# Patient Record
Sex: Female | Born: 2000 | Race: White | Hispanic: No | Marital: Single | Smoking: Never smoker
Health system: Southern US, Community
[De-identification: ages and names within clinical notes are randomized; demographics above are authoritative.]

## PROBLEM LIST (undated history)

## (undated) HISTORY — PX: TYMPANOSTOMY TUBE PLACEMENT: SHX32

---
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
## 2021-01-18 ENCOUNTER — Telehealth (INDEPENDENT_AMBULATORY_CARE_PROVIDER_SITE_OTHER): Payer: Medicaid Other | Admitting: Family

## 2021-01-18 ENCOUNTER — Other Ambulatory Visit: Payer: Self-pay

## 2021-01-18 ENCOUNTER — Encounter: Payer: Self-pay | Admitting: Family

## 2021-01-18 DIAGNOSIS — F411 Generalized anxiety disorder: Secondary | ICD-10-CM

## 2021-01-18 DIAGNOSIS — Z719 Counseling, unspecified: Secondary | ICD-10-CM | POA: Diagnosis not present

## 2021-01-18 DIAGNOSIS — Z7189 Other specified counseling: Secondary | ICD-10-CM

## 2021-01-18 DIAGNOSIS — Z79899 Other long term (current) drug therapy: Secondary | ICD-10-CM | POA: Diagnosis not present

## 2021-01-18 DIAGNOSIS — F900 Attention-deficit hyperactivity disorder, predominantly inattentive type: Secondary | ICD-10-CM | POA: Diagnosis not present

## 2021-01-18 NOTE — Progress Notes (Signed)
[facility] [address], [place] [place] [address] Dept: [phone] Dept Fax: [phone]  Medication Check visit via Virtual Video   Patient ID:  [name]  female DOB: 01/21/2001   20 y.o.   MRN: [id number]   DATE:01/18/21  PCP: [name] Corning, MD  Virtual Visit via Video Note  I connected with  [name]  on 01/18/21 at  7:30 AM EDT by a video enabled telemedicine application and verified that I am speaking with the correct person using two identifiers. Patient/Parent Location: at school   [name],you are scheduled for a virtual visit with your provider today.    Just as we do with appointments in the office, we must obtain your consent to participate.  Your consent will be active for this visit and any virtual visit you may have with one of our providers in the next 365 days.    If you have a MyChart account, I can also send a copy of this consent to you electronically.  All virtual visits are billed to your insurance company just like a traditional visit in the office.  As this is a virtual visit, video technology does not allow for your provider to perform a traditional examination.  This may limit your provider's ability to fully assess your condition.  If your provider identifies any concerns that need to be evaluated in person or the need to arrange testing such as labs, EKG, etc, we will make arrangements to do so.    Although advances in technology are sophisticated, we cannot ensure that it will always work on either your end or our end.  If the connection with a video visit is poor, we may have to switch to a telephone visit.  With either a video or telephone visit, we are not always able to ensure that we have a secure connection.   I need to obtain your verbal consent now.   Are you willing to proceed with your visit today?   [name] has provided verbal consent on 01/18/2021 for a  virtual visit (video or telephone).  [name] Curie, NP 01/18/2021  7:10 PM   I discussed the limitations, risks, security and privacy concerns of performing an evaluation and management service by telephone and the availability of in person appointments. I also discussed with the parents that there may be a patient responsible charge related to this service. The parents expressed understanding and agreed to proceed.  Provider: Carron Curie, NP  Location: private work location  HPI/CURRENT STATUS: [name] is here for medication management of the psychoactive medications for ADHD and review of educational and behavioral concerns.   [name] currently taking Concerta 36 mg daily, which is working well. Takes medication in the morning with breakfast. Medication tends to wear off around evening time and can take her Ritalin 5 mg PRN. [name] is able to focus through school/homework.   [name] is eating well (eating breakfast, lunch and dinner). Eating well with no issues reported.  Sleeping well (goes to bed at 12:00 am wakes at 9:00 am), sleeping through the night.   EDUCATION: School: [employer] basic classes Year/Grade:  freshman   Performance/ Grades: above average Services: Other: No services Sorority at [employer] and nightly meetings Chess club Activities/ Exercise: intermittently  Screen time: (phone, tablet, TV, computer): computer, phone, TV and movies.   MEDICAL HISTORY: Individual Medical History/ Review of Systems: Yes, Cold/Cough  from change in environment with moving into dorm room.   Family Medical/ Social History: Changes? No Patient Lives with:  has a roommate in a suite style room  MENTAL HEALTH: Mental Health Issues:    None reported by patient     Allergies: Allergies  Allergen Reactions   Norelgestromin-Eth Estradiol Dermatitis   Current Medications:  Current Outpatient Medications on File Prior to Visit  Medication Sig Dispense Refill    AUROVELA 24 FE 1-20 MG-MCG(24) tablet Take 1 tablet by mouth daily.     DIFFERIN 0.1 % cream Apply topically.     methylphenidate (CONCERTA) 36 MG PO CR tablet Take 2 tablets (72 mg total) by mouth every morning. 60 tablet 0   methylphenidate (RITALIN) 5 MG tablet Take 1 tablet (5 mg total) by mouth as needed. 30 tablet 0   ibuprofen (ADVIL,MOTRIN) 600 MG tablet Take 1 tablet (600 mg total) by mouth every 6 (six) hours as needed. (Patient not taking: No sig reported) 30 tablet 0   No current facility-administered medications on file prior to visit.   Medication Side Effects: None  DIAGNOSES:    ICD-10-CM   1. ADHD (attention deficit hyperactivity disorder), inattentive type  F90.0     2. Generalized anxiety disorder  F41.1     3. Medication management  Z79.899     4. Patient counseled  Z71.9     5. Goals of care, counseling/discussion  Z71.89      ASSESSMENT: [name] is a 20 year old female with a history of ADHD and anxiety, She has been well controlled on Concerta 36 mg 1-2 daily for 6 out of 7 days and using Ritalin 5 mg PRN for homework. Started school at [employer] this semester with positive report with classes both in person and online for fall semester. Participating in [employer] and joined a sorority at school. Some activity on a regular basis with her sorority sisters. NO changes with eating or sleeping at school. Had a recent viral illness with cold symptoms with OTC treatment and no other health concerns. To continue with the same medication with no changes today.   PLAN/RECOMMENDATIONS:  Patient provided updates with school, adjustment to the class schedule, sorority and social events.  [name] is not currently getting services at school. Extra help is available if needed with TA groups or other tutoring.   Recent issues with a viral illness with cough/cold with changes in environment. OTC medication for treatment. Support and education provided for immune support.   Discussed structure  at school and daily routine to assist with consistency along with academic success.   Eating well with no concerns with getting "edible" food at school. Encouraged a healthy variety of foods daily and plenty of water.   Discussed exercise, activities and social interactions on campus with her sorority  Sleep hygiene and routine each night discussed. Patient not having any sleep issues or adjustment in new environment.   Counseled medication pharmacokinetics, options, dosage, administration, desired effects, and possible side effects.   Concerta 36 mg 1-2 daily, no Rx today Ritaln 5 mg in the pm PRN   I discussed the assessment and treatment plan with the patient. The patient was provided an opportunity to ask questions and all were answered. The patient agreed with the plan and demonstrated an understanding of the instructions.   I provided 27 minutes of non-face-to-face time during this encounter. Completed record review for 10 minutes prior to the virtual video visit.   NEXT APPOINTMENT:  02/22/2021  Return in about 3 months (around 04/19/2021) for f/u visit.  The patient was advised to call back or seek an in-person evaluation if the symptoms worsen or if the condition fails to improve as anticipated.   [name] Curie, NP

## 2021-01-28 ENCOUNTER — Emergency Department (HOSPITAL_COMMUNITY): Payer: Medicaid Other

## 2021-01-28 ENCOUNTER — Inpatient Hospital Stay (HOSPITAL_COMMUNITY)
Admission: EM | Admit: 2021-01-28 | Discharge: 2021-01-29 | DRG: 153 | Disposition: A | Discharge status: Home / Self Care | Payer: Medicaid Other | Attending: Family Medicine | Admitting: Family Medicine

## 2021-01-28 ENCOUNTER — Encounter (HOSPITAL_COMMUNITY): Payer: Self-pay

## 2021-01-28 DIAGNOSIS — J390 Retropharyngeal and parapharyngeal abscess: Secondary | ICD-10-CM | POA: Diagnosis present

## 2021-01-28 DIAGNOSIS — Z79899 Other long term (current) drug therapy: Secondary | ICD-10-CM | POA: Diagnosis not present

## 2021-01-28 DIAGNOSIS — F900 Attention-deficit hyperactivity disorder, predominantly inattentive type: Secondary | ICD-10-CM | POA: Diagnosis present

## 2021-01-28 DIAGNOSIS — D72829 Elevated white blood cell count, unspecified: Secondary | ICD-10-CM | POA: Diagnosis not present

## 2021-01-28 DIAGNOSIS — Z888 Allergy status to other drugs, medicaments and biological substances status: Secondary | ICD-10-CM

## 2021-01-28 DIAGNOSIS — F411 Generalized anxiety disorder: Secondary | ICD-10-CM | POA: Diagnosis present

## 2021-01-28 DIAGNOSIS — J069 Acute upper respiratory infection, unspecified: Secondary | ICD-10-CM

## 2021-01-28 DIAGNOSIS — Z20822 Contact with and (suspected) exposure to covid-19: Secondary | ICD-10-CM | POA: Diagnosis present

## 2021-01-28 LAB — RESPIRATORY PANEL BY PCR

## 2021-01-28 LAB — COMPREHENSIVE METABOLIC PANEL
ALT: 16 U/L (ref 0–44)
AST: 17 U/L (ref 15–41)
Albumin: 3.7 g/dL (ref 3.5–5.0)
Alkaline Phosphatase: 42 U/L (ref 38–126)
Anion gap: 7 (ref 5–15)
BUN: 7 mg/dL (ref 6–20)
CO2: 22 mmol/L (ref 22–32)
Calcium: 9.3 mg/dL (ref 8.9–10.3)
Chloride: 108 mmol/L (ref 98–111)
Creatinine, Ser: 0.66 mg/dL (ref 0.44–1.00)
GFR, Estimated: >60 mL/min (ref >60)
Glucose, Bld: 113 mg/dL — ABNORMAL HIGH (ref 70–99)
Potassium: 3.5 mmol/L (ref 3.5–5.1)
Sodium: 137 mmol/L (ref 135–145)
Total Bilirubin: 0.6 mg/dL (ref 0.3–1.2)
Total Protein: 8.3 g/dL — ABNORMAL HIGH (ref 6.5–8.1)

## 2021-01-28 LAB — CSF CELL COUNT WITH DIFFERENTIAL
RBC Count, CSF: 1 /mm3 — ABNORMAL HIGH
RBC Count, CSF: 2 /mm3 — ABNORMAL HIGH
Tube #: 1
Tube #: 4
WBC, CSF: 1 /mm3 (ref 0–5)
WBC, CSF: 1 /mm3 (ref 0–5)

## 2021-01-28 LAB — RESP PANEL BY RT-PCR (FLU A&B, COVID) ARPGX2
Influenza A by PCR: NEGATIVE
Influenza B by PCR: NEGATIVE
SARS Coronavirus 2 by RT PCR: NEGATIVE

## 2021-01-28 LAB — URINALYSIS, ROUTINE W REFLEX MICROSCOPIC
Bilirubin Urine: NEGATIVE
Glucose, UA: NEGATIVE mg/dL
Hgb urine dipstick: NEGATIVE
Ketones, ur: NEGATIVE mg/dL
Nitrite: NEGATIVE
Protein, ur: NEGATIVE mg/dL
Specific Gravity, Urine: 1.005 (ref 1.005–1.030)
pH: 7 (ref 5.0–8.0)

## 2021-01-28 LAB — PROTEIN, CSF: Total  Protein, CSF: 24 mg/dL (ref 15–45)

## 2021-01-28 LAB — CBC WITH DIFFERENTIAL/PLATELET
Abs Immature Granulocytes: 0.08 10*3/uL — ABNORMAL HIGH (ref 0.00–0.07)
Basophils Absolute: 0 10*3/uL (ref 0.0–0.1)
Basophils Relative: 0 %
Eosinophils Absolute: 0 10*3/uL (ref 0.0–0.5)
Eosinophils Relative: 0 %
HCT: 39 % (ref 36.0–46.0)
Hemoglobin: 13 g/dL (ref 12.0–15.0)
Immature Granulocytes: 0 %
Lymphocytes Relative: 9 %
Lymphs Abs: 1.9 10*3/uL (ref 0.7–4.0)
MCH: 28.8 pg (ref 26.0–34.0)
MCHC: 33.3 g/dL (ref 30.0–36.0)
MCV: 86.3 fL (ref 80.0–100.0)
Monocytes Absolute: 1.2 10*3/uL — ABNORMAL HIGH (ref 0.1–1.0)
Monocytes Relative: 6 %
Neutro Abs: 17.5 10*3/uL — ABNORMAL HIGH (ref 1.7–7.7)
Neutrophils Relative %: 85 %
Platelets: 223 10*3/uL (ref 150–400)
RBC: 4.52 MIL/uL (ref 3.87–5.11)
RDW: 13.5 % (ref 11.5–15.5)
WBC: 20.8 10*3/uL — ABNORMAL HIGH (ref 4.0–10.5)
nRBC: 0 % (ref 0.0–0.2)

## 2021-01-28 LAB — GLUCOSE, CSF: Glucose, CSF: 70 mg/dL (ref 40–70)

## 2021-01-28 LAB — LACTIC ACID, PLASMA: Lactic Acid, Venous: 0.7 mmol/L (ref 0.5–1.9)

## 2021-01-28 MED ORDER — LIDOCAINE HCL 1 % IJ SOLN
INTRAMUSCULAR | Status: AC
  Filled 2021-01-28: qty 20

## 2021-01-28 MED ORDER — LIDOCAINE HCL (PF) 1 % IJ SOLN
5.0000 mL | Freq: Once | INTRAMUSCULAR | Status: AC
  Administered 2021-01-28: 5 mL via INTRADERMAL
  Filled 2021-01-28: qty 30

## 2021-01-28 MED ORDER — HYDROCODONE-ACETAMINOPHEN 5-325 MG PO TABS: 1.0000 | ORAL_TABLET | ORAL | Status: DC | PRN

## 2021-01-28 MED ORDER — SODIUM CHLORIDE 0.9 % IV BOLUS
1000.0000 mL | Freq: Once | INTRAVENOUS | Status: AC
  Administered 2021-01-28: 1000 mL via INTRAVENOUS

## 2021-01-28 MED ORDER — CLINDAMYCIN PHOSPHATE 600 MG/50ML IV SOLN
600.0000 mg | Freq: Once | INTRAVENOUS | Status: AC
  Administered 2021-01-28: 600 mg via INTRAVENOUS
  Filled 2021-01-28: qty 50

## 2021-01-28 MED ORDER — IOHEXOL 350 MG/ML SOLN
80.0000 mL | Freq: Once | INTRAVENOUS | Status: AC | PRN
  Administered 2021-01-28: 60 mL via INTRAVENOUS

## 2021-01-28 MED ORDER — ACETAMINOPHEN 650 MG RE SUPP: 650.0000 mg | Freq: Four times a day (QID) | RECTAL | Status: DC | PRN

## 2021-01-28 MED ORDER — ACETAMINOPHEN 325 MG PO TABS: 650.0000 mg | ORAL_TABLET | Freq: Four times a day (QID) | ORAL | Status: DC | PRN

## 2021-01-28 MED ORDER — SODIUM CHLORIDE 0.9 % IV SOLN
75.0000 mL/h | INTRAVENOUS | Status: DC
  Administered 2021-01-28: 75 mL/h via INTRAVENOUS

## 2021-01-28 MED ORDER — GADOBUTROL 1 MMOL/ML IV SOLN
5.0000 mL | Freq: Once | INTRAVENOUS | Status: AC | PRN
  Administered 2021-01-28: 5 mL via INTRAVENOUS

## 2021-01-28 MED ORDER — SODIUM CHLORIDE (PF) 0.9 % IJ SOLN
INTRAMUSCULAR | Status: AC
  Filled 2021-01-28: qty 50

## 2021-01-28 MED ORDER — CLINDAMYCIN PHOSPHATE 600 MG/50ML IV SOLN
600.0000 mg | Freq: Three times a day (TID) | INTRAVENOUS | Status: DC
  Administered 2021-01-28 – 2021-01-29 (×3): 600 mg via INTRAVENOUS
  Filled 2021-01-28 (×3): qty 50

## 2021-01-28 MED ORDER — KETOROLAC TROMETHAMINE 30 MG/ML IJ SOLN
15.0000 mg | Freq: Once | INTRAMUSCULAR | Status: AC
  Administered 2021-01-28: 15 mg via INTRAVENOUS
  Filled 2021-01-28: qty 1

## 2021-01-28 MED ORDER — DEXAMETHASONE SODIUM PHOSPHATE 10 MG/ML IJ SOLN
10.0000 mg | Freq: Once | INTRAMUSCULAR | Status: AC
  Administered 2021-01-28: 10 mg via INTRAVENOUS
  Filled 2021-01-28: qty 1

## 2021-01-28 NOTE — ED Notes (Signed)
Pt transported to MRI 

## 2021-01-28 NOTE — ED Provider Notes (Signed)
  Physical Exam  BP 120/85   Pulse 90   Temp 98.7 F (37.1 C) (Oral)   Resp 16   Ht 5\' 2"  (1.575 m)   Wt 49.9 kg   LMP 01/14/2021   SpO2 98%   BMI 20.12 kg/m   Physical Exam  ED Course/Procedures   Clinical Course as of 01/28/21 1738  Thu Jan 28, 2021  0930 Findings discussed with patient and mother, they agreed to proceed with LP. [EW]  1430 Case discussed with neurologist, who recommended MRI imaging then discharge if no acute findings, to follow-up with outpatient neurology for persistent neurologic symptoms if needed. [EW]    Clinical Course User Index [EW] Jan 30, 2021, MD    Procedures  MDM  Care assumed at 3 PM.  Patient has neck pain and stiffness.  Patient has white blood cell count of 20,000.  LP performed and did not show any obvious meningitis.  Since patient has some left-sided numbness, MRI brain was performed   4 pm MRI did not show any acute process.  However there is an incidental abscess in the neck.  We will get a dedicated CT neck.  5:39 PM CT showed left retropharyngeal abscess.  Patient has no trouble swallowing and just has some swelling in the left neck area.  I discussed case with Dr. Mancel Bale from ENT.  He states that patient can get clindamycin 3 times daily IV. He will see patient tomorrow and recommend IV antibiotics and observation under the hospitalist service overnight.  CRITICAL CARE Performed by: Suszanne Conners   Total critical care time:30 minutes  Critical care time was exclusive of separately billable procedures and treating other patients.  Critical care was necessary to treat or prevent imminent or life-threatening deterioration.  Critical care was time spent personally by me on the following activities: development of treatment plan with patient and/or surrogate as well as nursing, discussions with consultants, evaluation of patient's response to treatment, examination of patient, obtaining history from patient or surrogate, ordering  and performing treatments and interventions, ordering and review of laboratory studies, ordering and review of radiographic studies, pulse oximetry and re-evaluation of patient's condition.        Richardean Canal, MD 01/28/21 [phone]

## 2021-01-28 NOTE — ED Notes (Signed)
Pt tolerated LP extremely well. Consent signed prior to procedure.

## 2021-01-28 NOTE — ED Provider Notes (Signed)
[facility]-EMERGENCY DEPT Provider Note   CSN: [id number] Arrival date & time: 01/28/21  6301     History Chief Complaint  Patient presents with   Headache   Neck Pain    [name] is a 20 y.o. female.  HPI She complains of left-sided headache and left-sided neck pain, onset several days ago, persistent despite treatment with prednisone for "URI."  Diagnosed with URI after evaluation in urgent care where she had a COVID test which was negative.  She has been vaccinated against COVID.  She thinks she has been vaccinated for meningitis.  She is here with her mother.  She is a Archivist.  She does not have current sinus congestion or drainage.  She denies cough.  She has had some chills but no documented fever.  She denies nausea, vomiting, dizziness, difficulty walking, near syncope or paresthesia.  No prior similar problems.  No known sick contacts.  There are no other known active modifying factors.    History reviewed. No pertinent past medical history.  Patient Active Problem List   Diagnosis Date Noted   Macromastia 09/10/2019   Generalized anxiety disorder 06/26/2018   ADHD (attention deficit hyperactivity disorder), inattentive type 06/26/2018    Past Surgical History:  Procedure Laterality Date   INCISION AND DRAINAGE OF PERITONSILLAR ABCESS N/A 08/03/2016   Procedure: INCISION AND DRAINAGE OF PERITONSILLAR ABCESS;  Surgeon: Flo Shanks, MD;  Location: [facility] OR;  Service: ENT;  Laterality: N/A;   TONSILLECTOMY AND ADENOIDECTOMY Bilateral 12/19/2017   Procedure: TONSILLECTOMY AND ADENOIDECTOMY;  Surgeon: Flo Shanks, MD;  Location: [facility] OR;  Service: ENT;  Laterality: Bilateral;   TYMPANOSTOMY TUBE PLACEMENT       OB History   No obstetric history on file.     History reviewed. No pertinent family history.  Social History   Tobacco Use   Smoking status: Never   Smokeless tobacco: Never  Vaping Use   Vaping Use: Never used    Home  Medications Prior to Admission medications   Medication Sig Start Date End Date Taking? Authorizing Provider  AUROVELA 24 FE 1-20 MG-MCG(24) tablet Take 1 tablet by mouth daily. 03/30/20   [provider]  DIFFERIN 0.1 % cream Apply topically. 09/19/19   [provider]  ibuprofen (ADVIL,MOTRIN) 600 MG tablet Take 1 tablet (600 mg total) by mouth every 6 (six) hours as needed. Patient not taking: No sig reported 12/16/17   Vicki Mallet, MD  methylphenidate (CONCERTA) 36 MG PO CR tablet Take 2 tablets (72 mg total) by mouth every morning. 12/31/20   Crump, Priscille Loveless A, NP  methylphenidate (RITALIN) 5 MG tablet Take 1 tablet (5 mg total) by mouth as needed. 04/08/20   Paretta-Leahey, Miachel Roux, NP    Allergies    Norelgestromin-eth estradiol  Review of Systems   Review of Systems  All other systems reviewed and are negative.  Physical Exam Updated Vital Signs BP (!) 144/102   Pulse (!) 120   Temp 99 F (37.2 C) (Oral)   Resp 20   Ht 5\' 2"  (1.575 m)   Wt 49.9 kg   SpO2 100%   BMI 20.12 kg/m   Physical Exam Vitals and nursing note reviewed.  Constitutional:      General: She is not in acute distress.    Appearance: She is well-developed. She is not ill-appearing, toxic-appearing or diaphoretic.  HENT:     Head: Normocephalic and atraumatic.     Right Ear: External  ear normal.     Left Ear: External ear normal.  Eyes:     Conjunctiva/sclera: Conjunctivae normal.     Pupils: Pupils are equal, round, and reactive to light.  Neck:     Trachea: Phonation normal.     Comments: She is able to touch her chin to her chest without aggravation of neck pain.  She had increased neck pain with neck extension. Cardiovascular:     Rate and Rhythm: Normal rate and regular rhythm.     Heart sounds: Normal heart sounds.  Pulmonary:     Effort: Pulmonary effort is normal.     Breath sounds: Normal breath sounds.  Abdominal:     Palpations: Abdomen is soft.     Tenderness:  There is no abdominal tenderness.  Musculoskeletal:        General: Normal range of motion.     Cervical back: Normal range of motion and neck supple. No rigidity.  Skin:    General: Skin is warm and dry.     Comments: Ill-defined splotchy red rash, noted over posterior neck and upper shoulder regions.  Neurological:     Mental Status: She is alert and oriented to person, place, and time.     Cranial Nerves: No cranial nerve deficit.     Sensory: No sensory deficit.     Motor: No abnormal muscle tone.     Coordination: Coordination normal.  Psychiatric:        Mood and Affect: Mood normal.        Behavior: Behavior normal.        Thought Content: Thought content normal.        Judgment: Judgment normal.    ED Results / Procedures / Treatments   Labs (all labs ordered are listed, but only abnormal results are displayed) Labs Reviewed - No data to display  EKG None  Radiology No results found.  Procedures .Lumbar Puncture  Date/Time: 01/28/2021 5:30 PM Performed by: Mancel Bale, MD Authorized by: Mancel Bale, MD   Consent:    Consent obtained:  Verbal and written   Consent given by:  Patient   Risks, benefits, and alternatives were discussed: yes     Risks discussed:  Bleeding and infection Universal protocol:    Site/side marked: no     Patient identity confirmed:  Verbally with patient Pre-procedure details:    Procedure purpose:  Diagnostic   Preparation: Patient was prepped and draped in usual sterile fashion   Anesthesia:    Anesthesia method:  Local infiltration   Local anesthetic:  Lidocaine 1% w/o epi Procedure details:    Lumbar space:  L3-L4 interspace   Patient position:  L lateral decubitus   Needle gauge:  22   Needle type:  Diamond point   Needle length (in):  3.5   Ultrasound guidance: no     Number of attempts:  1   Fluid appearance:  Clear   Tubes of fluid:  4   Total volume (ml):  5 Post-procedure details:    Puncture site:   Adhesive bandage applied   Procedure completion:  Tolerated well, no immediate complications .Critical Care Performed by: Mancel Bale, MD Authorized by: Mancel Bale, MD   Critical care provider statement:    Critical care time (minutes):  35   Critical care start time:  01/28/2021 7:30 AM   Critical care end time:  01/28/2021 2:45 PM   Critical care time was exclusive of:  Separately billable procedures and treating other  patients   Critical care was necessary to treat or prevent imminent or life-threatening deterioration of the following conditions:  CNS failure or compromise   Critical care was time spent personally by me on the following activities:  Blood draw for specimens, development of treatment plan with patient or surrogate, discussions with consultants, evaluation of patient's response to treatment, examination of patient, obtaining history from patient or surrogate, ordering and performing treatments and interventions, ordering and review of laboratory studies, pulse oximetry, re-evaluation of patient's condition, review of old charts and ordering and review of radiographic studies   Medications Ordered in ED Medications - No data to display  ED Course  I have reviewed the triage vital signs and the nursing notes.  Pertinent labs & imaging results that were available during my care of the patient were reviewed by me and considered in my medical decision making (see chart for details).  Clinical Course as of 01/28/21 1730  Thu Jan 28, 2021  0930 Findings discussed with patient and mother, they agreed to proceed with LP. [EW]  1430 Case discussed with neurologist, who recommended MRI imaging then discharge if no acute findings, to follow-up with outpatient neurology for persistent neurologic symptoms if needed. [EW]    Clinical Course User Index [EW] Mancel Bale, MD   MDM Rules/Calculators/A&P                            Patient Vitals for the past 24 hrs:  BP Temp  Temp src Pulse Resp SpO2 Height Weight  01/28/21 0712 -- -- -- 93 -- 99 % -- --  01/28/21 0700 (!) 144/102 -- -- (!) 120 20 100 % -- --  01/28/21 0615 -- -- -- -- -- 100 % -- --  01/28/21 0527 (!) 136/94 99 F (37.2 C) Oral 85 18 100 % 5\' 2"  (1.575 m) 49.9 kg   2:25 PM.  Patient reevaluated for complaint of left sided tongue numbness.  At this time she does not have any facial asymmetry, but complains of mild light touch dysesthesia of the forehead and left cheek.  Cranial nerves otherwise appear normal.  Normal grip strength and sensation of the hands bilaterally.  No clinical evidence for Bell's palsy.  No significant dysarthria.  2:30 PM- reevaluation with update and discussion. After initial assessment and treatment, an updated evaluation reveals no change in clinical status.  MRI pending.   Medical Decision Making:  This patient is presenting for evaluation of headache and neck pain, while being treated for URI, which does require a range of treatment options, and is a complaint that involves a high risk of morbidity and mortality. The differential diagnoses include URI, COVID infection, meningitis, intracranial bleeding. I decided to review old records, and in summary healthy teenager presenting with ongoing symptoms mostly consistent with URI.  No prior similar symptoms.  Currently being treated with prednisone.  I obtained additional historical information from mother at bedside.  Clinical Laboratory Tests Ordered, included CBC, Metabolic panel, and Urinalysis, spinal fluid study. Review indicates normal except white count elevated. Radiologic Tests Ordered, included CT head.  I independently Visualized: Radiographic images, which show no acute abnormalities    Critical Interventions-clinical evaluation, laboratory testing, lumbar puncture, radiography, observation and reassessment  After These Interventions, the Patient was reevaluated and was found with new finding  for possible intracranial abnormality, requiring MR imaging.  Screening for infectious processes, with spinal tap, and blood work, indicates elevated  white count which is nonspecific.  Initial viral panel negative.  Respiratory panel ordered.  Patient's symptoms are somewhat vague and she is currently being treated with prednisone.  CRITICAL CARE-yes Performed by: Mancel Bale  Nursing Notes Reviewed/ Care Coordinated Applicable Imaging Reviewed Interpretation of Laboratory Data incorporated into ED treatment  3:10 PM-care changes sent to Dr. Silverio Lay to disposition following MRI imaging.    Final Clinical Impression(s) / ED Diagnoses Final diagnoses:  Upper respiratory tract infection, unspecified type    Rx / DC Orders ED Discharge Orders     None        Mancel Bale, MD 01/28/21 1732

## 2021-01-28 NOTE — ED Notes (Signed)
Water bottle filled for pt. No complaints at this time.

## 2021-01-28 NOTE — ED Triage Notes (Addendum)
Patient arrives from home with complaint of L side neck pain that radiates to the left side of head. Pt also reports jaw pain as well as L eye pain. Pain has been ongoing since 09/24, seen recently at Urgent care and diagnosed with upper respiratory infection. Denies any injury to the area.

## 2021-01-28 NOTE — H&P (Signed)
[name] CWU:[id number] DOB: 17-Jan-2001 DOA: 01/28/2021     PCP: Marcene Corning, MD   Outpatient Specialists:  NONE    Patient arrived to ER on 01/28/21 at 0520 Referred by Attending Charlynne Pander, MD   Patient coming from: home Lives alone,       Chief Complaint:   Chief Complaint  Patient presents with   Headache   Neck Pain    HPI: [name] is a 20 y.o. female with medical history significant of tonsillitis, peritonsil abscess, tonsillectomy, ADHD    Presented with   started to have neck discomfort 5 days ago mainly on the left descried as neck stiffness Goes to [facility] in [place] No fever no chills   Reported some left side of her tongue feeling numb started today She had a URI 2 wks mild sore throat 2 days ago she was seen in urgent care and was given a dose of prednisone she only took 2 tablets and today decided to come to emergency department because she was clearly not feeling well. No urinary complaints Drinks socially  Has  been vaccinated against COVID  and boosted   Initial COVID TEST  NEGATIVE   Lab Results  Component Value Date   SARSCOV2NAA NEGATIVE 01/28/2021   SARSCOV2NAA Not Detected 12/28/2018    While in ER:  LP wnl MRI- showed possible abscess CT neck confirmed 1 cm retropharyngeal abcsess  ENT was consulted ED Triage Vitals  Enc Vitals Group     BP 01/28/21 0527 (!) 136/94     Pulse Rate 01/28/21 0527 85     Resp 01/28/21 0527 18     Temp 01/28/21 0527 99 F (37.2 C)     Temp Source 01/28/21 0527 Oral     SpO2 01/28/21 0527 100 %     Weight 01/28/21 0527 110 lb (49.9 kg)     Height 01/28/21 0527 5\' 2"  (1.575 m)     Head Circumference --      Peak Flow --      Pain Score 01/28/21 0616 8     Pain Loc --      Pain Edu? --      Excl. in GC? --   TMAX(24)@     _________________________________________ Significant initial  Findings: Abnormal Labs Reviewed  COMPREHENSIVE METABOLIC PANEL - Abnormal; Notable  for the following components:      Result Value   Glucose, Bld 113 (*)    Total Protein 8.3 (*)    All other components within normal limits  CBC WITH DIFFERENTIAL/PLATELET - Abnormal; Notable for the following components:   WBC 20.8 (*)    Neutro Abs 17.5 (*)    Monocytes Absolute 1.2 (*)    Abs Immature Granulocytes 0.08 (*)    All other components within normal limits  CSF CELL COUNT WITH DIFFERENTIAL - Abnormal; Notable for the following components:   Appearance, CSF CLEAR (*)    RBC Count, CSF 2 (*)    All other components within normal limits  CSF CELL COUNT WITH DIFFERENTIAL - Abnormal; Notable for the following components:   Appearance, CSF CLEAR (*)    RBC Count, CSF 1 (*)    All other components within normal limits  URINALYSIS, ROUTINE W REFLEX MICROSCOPIC - Abnormal; Notable for the following components:   Leukocytes,Ua MODERATE (*)    Bacteria, UA RARE (*)    All other components within normal limits   ____________________________________________ Ordered CT HEAD  NON acute  mRI - Abnormal edema, enhancement, and soft tissue thickening involving the left skull base and upper neck, detailed above and concerning for a severe deep neck infection. Discrete 1.2 cm peripherally enhancing fluid collection in the left prevertebral space is concerning for an abscess. Given findings are incompletely imaged, recommend CT neck with contrast. CXR -  NON acute   CT neck - 1.4 cm x 1.0 cm x 1.4 cm abscess vs suppurative lateral retropharyngeal node in the lateral left retropharyngeal space with surrounding soft tissue thickening and inflammatory change resulting in mild narrowing of the nasopharyngeal airway. The collection abuts the medial aspect of the internal carotid artery which remains patent. _________________________    The recent clinical data is shown below. Vitals:   01/28/21 1700 01/28/21 1730 01/28/21 1800 01/28/21 1830  BP: 112/83 120/85 123/81 118/77   Pulse: 86 90 84 93  Resp: 16 16 16 16   Temp:      TempSrc:      SpO2: 100% 98% 98% 98%  Weight:      Height:        WBC     Component Value Date/Time   WBC 20.8 (H) 01/28/2021 0745   LYMPHSABS 1.9 01/28/2021 0745   MONOABS 1.2 (H) 01/28/2021 0745   EOSABS 0.0 01/28/2021 0745   BASOSABS 0.0 01/28/2021 0745      Lactic Acid, Venous    Component Value Date/Time   LATICACIDVEN 0.7 01/28/2021 0745      UA  no evidence of UTI      Urine analysis:    Component Value Date/Time   COLORURINE YELLOW 01/28/2021 0957   APPEARANCEUR CLEAR 01/28/2021 0957   LABSPEC 1.005 01/28/2021 0957   PHURINE 7.0 01/28/2021 0957   GLUCOSEU NEGATIVE 01/28/2021 0957   HGBUR NEGATIVE 01/28/2021 0957   BILIRUBINUR NEGATIVE 01/28/2021 0957   KETONESUR NEGATIVE 01/28/2021 0957   PROTEINUR NEGATIVE 01/28/2021 0957   NITRITE NEGATIVE 01/28/2021 0957   LEUKOCYTESUR MODERATE (A) 01/28/2021 0957    Results for orders placed or performed during the hospital encounter of 01/28/21  Resp Panel by RT-PCR (Flu A&B, Covid) Nasopharyngeal Swab     Status: None   Collection Time: 01/28/21  7:45 AM   Specimen: Nasopharyngeal Swab; Nasopharyngeal(NP) swabs in vial transport medium  Result Value Ref Range Status   SARS Coronavirus 2 by RT PCR NEGATIVE NEGATIVE Final         Influenza A by PCR NEGATIVE NEGATIVE Final   Influenza B by PCR NEGATIVE NEGATIVE Final        Respiratory (~20 pathogens) panel by PCR     Status: None   Collection Time: 01/28/21  7:45 AM   Specimen: Nasopharyngeal Swab; Respiratory  Result Value Ref Range Status   Adenovirus NOT DETECTED NOT DETECTED Final   Coronavirus 229E NOT DETECTED NOT DETECTED Final    Comment: (NOTE) The Coronavirus on the Respiratory Panel, DOES NOT test for the novel  Coronavirus (2019 nCoV)    Coronavirus HKU1 NOT DETECTED NOT DETECTED Final   Coronavirus NL63 NOT DETECTED NOT DETECTED Final   Coronavirus OC43 NOT DETECTED NOT DETECTED Final    Metapneumovirus NOT DETECTED NOT DETECTED Final   Rhinovirus / Enterovirus NOT DETECTED NOT DETECTED Final   Influenza A NOT DETECTED NOT DETECTED Final   Influenza B NOT DETECTED NOT DETECTED Final   Parainfluenza Virus 1 NOT DETECTED NOT DETECTED Final   Parainfluenza Virus 2 NOT DETECTED NOT DETECTED Final   Parainfluenza Virus 3  NOT DETECTED NOT DETECTED Final   Parainfluenza Virus 4 NOT DETECTED NOT DETECTED Final   Respiratory Syncytial Virus NOT DETECTED NOT DETECTED Final   Bordetella pertussis NOT DETECTED NOT DETECTED Final   Bordetella Parapertussis NOT DETECTED NOT DETECTED Final   Chlamydophila pneumoniae NOT DETECTED NOT DETECTED Final   Mycoplasma pneumoniae NOT DETECTED NOT DETECTED Final    Comment: Performed at [facility], [address], [place], [place] [address]  Culture, blood (routine x 2)     Status: None (Preliminary result)   Collection Time: 01/28/21  7:48 AM   Specimen: BLOOD  Result Value Ref Range Status   Specimen Description   Final    BLOOD LEFT ANTECUBITAL Performed at [facility], [address], [place], [place] [address]    Special Requests   Final    BOTTLES DRAWN AEROBIC AND ANAEROBIC Blood Culture adequate volume Performed at [facility], [address], [place], [place] [address]    Culture   Final    NO GROWTH < 12 HOURS Performed at [facility], [address] [place], [place] [address]    Report Status PENDING  Incomplete  CSF culture     Status: None (Preliminary result)   Collection Time: 01/28/21 10:23 AM   Specimen: Back; Cerebrospinal Fluid  Result Value Ref Range Status   Specimen Description BACK CSF  Final   Special Requests NONE  Final   Gram Stain   Final    NO WBC SEEN NO ORGANISMS SEEN Gram Stain Report Called to,Read Back By and Verified With: MCIVER,M. RN @1216  01/28/2021 NMCKOY Performed at [facility], [address] [place], [place]  [place]    Culture PENDING  Incomplete   Report Status PENDING  Incomplete     _______________________________________________________ ER Provider Called: ENT   Dr.Teoh They Recommend admit to medicine steroids 1 dose and clindamycin Will see in AM    _______________________________________________ Hospitalist was called for admission for retropharyngeal abscess  The following Work up has been ordered so far:  Orders Placed This Encounter  Procedures   Lumbar Puncture   Critical Care   Resp Panel by RT-PCR (Flu A&B, Covid) Nasopharyngeal Swab   Respiratory (~20 pathogens) panel by PCR   Culture, blood (routine x 2)   CSF culture   Urine Culture   CT Head Wo Contrast   MR Brain W and Wo Contrast   DG Chest Port 1 View   CT Soft Tissue Neck W Contrast   Comprehensive metabolic panel   CBC with Differential   CSF cell count with differential collection tube #: 1   CSF cell count with differential collection tube #: 4   Glucose, CSF   Protein, CSF   Urinalysis, Routine w reflex microscopic   Diet clear liquid Room service appropriate? Yes; Fluid consistency: Thin   Lumbar puncture tray to bedside   Consult to ear nose and throat   Consult to hospitalist   Droplet precaution      Following Medications were ordered in ER: Medications  lidocaine (XYLOCAINE) 1 % (with pres) injection (  Canceled Entry 01/28/21 1002)  sodium chloride (PF) 0.9 % injection (0 mLs  Hold 01/28/21 1651)  ketorolac (TORADOL) 30 MG/ML injection 15 mg (15 mg Intravenous Given 01/28/21 0743)  sodium chloride 0.9 % bolus 1,000 mL (0 mLs Intravenous Stopped 01/28/21 0843)  lidocaine (PF) (XYLOCAINE) 1 % injection 5 mL (5 mLs Intradermal Given by Other  01/28/21 1022)  gadobutrol (GADAVIST) 1 MMOL/ML injection 5 mL (5 mLs Intravenous Contrast Given 01/28/21 1430)  dexamethasone (DECADRON) injection 10 mg (10 mg Intravenous Given 01/28/21 1603)  clindamycin (CLEOCIN) IVPB 600 mg (0 mg Intravenous Stopped  01/28/21 1652)  iohexol (OMNIPAQUE) 350 MG/ML injection 80 mL (60 mLs Intravenous Contrast Given 01/28/21 1613)        Consult Orders  (From admission, onward)           Start     Ordered   01/28/21 1736  Consult to hospitalist  Once       Provider:  (Not yet assigned)  Question Answer Comment  Place call to: [facility]   Reason for Consult Admit      01/28/21 1735              OTHER Significant initial  Findings:  labs showing:    Recent Labs  Lab 01/28/21 0745  NA 137  K 3.5  CO2 22  GLUCOSE 113*  BUN 7  CREATININE 0.66  CALCIUM 9.3    Cr    stable,   Lab Results  Component Value Date   CREATININE 0.66 01/28/2021    Recent Labs  Lab 01/28/21 0745  AST 17  ALT 16  ALKPHOS 42  BILITOT 0.6  PROT 8.3*  ALBUMIN 3.7   Lab Results  Component Value Date   CALCIUM 9.3 01/28/2021          Plt: Lab Results  Component Value Date   PLT 223 01/28/2021       Recent Labs  Lab 01/28/21 0745  WBC 20.8*  NEUTROABS 17.5*  HGB 13.0  HCT 39.0  MCV 86.3  PLT 223    HG/HCT  stable,       Component Value Date/Time   HGB 13.0 01/28/2021 0745   HCT 39.0 01/28/2021 0745   MCV 86.3 01/28/2021 0745         Cultures:    Component Value Date/Time   SDES BACK CSF 01/28/2021 1023   SPECREQUEST NONE 01/28/2021 1023   CULT PENDING 01/28/2021 1023   REPTSTATUS PENDING 01/28/2021 1023     Radiological Exams on Admission: CT Head Wo Contrast  Result Date: 01/28/2021 CLINICAL DATA:  Left side headache EXAM: CT HEAD WITHOUT CONTRAST TECHNIQUE: Contiguous axial images were obtained from the base of the skull through the vertex without intravenous contrast. COMPARISON:  None. FINDINGS: Brain: No acute intracranial abnormality. Specifically, no hemorrhage, hydrocephalus, mass lesion, acute infarction, or significant intracranial injury. Vascular: No hyperdense vessel or unexpected calcification. Skull: No acute calvarial abnormality.  Sinuses/Orbits: No acute findings Other: None IMPRESSION: Normal study. Electronically Signed   By: Charlett Nose M.D.   On: 01/28/2021 08:47   CT Soft Tissue Neck W Contrast  Result Date: 01/28/2021 CLINICAL DATA:  Left-sided neck pain, abscess EXAM: CT NECK WITH CONTRAST TECHNIQUE: Multidetector CT imaging of the neck was performed using the standard protocol following the bolus administration of intravenous contrast. CONTRAST:  78mL OMNIPAQUE IOHEXOL 350 MG/ML SOLN COMPARISON:  Same-day head CT and brain MRI FINDINGS: Pharynx and larynx: The nasal cavity is normal. There is slight asymmetric effacement of the left nasopharynx. There is a 1.4 cm x 1.0 cm by 1.4 cm peripherally enhancing collection in the lateral left retropharyngeal space consistent with an abscess. There is thickening of the adjacent soft tissues in the carotid and parapharyngeal spaces. The collection abuts the medial aspect of the internal carotid artery which is patent. There is  mild stranding in the adjacent parapharyngeal fat. Mass effect from the collection and swelling effaces the internal jugular vein. The jugular bulb and internal jugular vein below the level of the collection is patent. A small locule of air within the left internal jugular vein is most likely due to intravenous access. There is a small retropharyngeal effusion measuring up to 5 mm in thickness resulting in further mild effacement of the nasopharyngeal airway and mild effacement of the oropharyngeal airway. There is no peripheral enhancement to suggest additional retropharyngeal abscess. This collection extends to the C5-C6 level without evidence of extension into the mediastinum. The oral cavity and oropharynx are otherwise normal. The hypopharynx and larynx are normal. Salivary glands: The parotid and submandibular glands are unremarkable. Thyroid: Unremarkable. Lymph nodes: There are prominent left-sided cervical chain lymph nodes measuring up to 1.0 cm in level  II, likely reactive. Vascular: The left-sided vasculature as described above. The right vasculature is unremarkable. Limited intracranial: The intracranial compartment as assessed on the separately dictated brain MRI. Visualized orbits: The imaged globes and orbits are unremarkable. Mastoids and visualized paranasal sinuses: There is mild mucosal thickening in the left maxillary sinus. The imaged mastoid air cells are clear. Skeleton: The bones are unremarkable. Upper chest: The lung apices are clear. Other: None. IMPRESSION: 1. 1.4 cm x 1.0 cm x 1.4 cm abscess vs suppurative lateral retropharyngeal node in the lateral left retropharyngeal space with surrounding soft tissue thickening and inflammatory change resulting in mild narrowing of the nasopharyngeal airway. The collection abuts the medial aspect of the internal carotid artery which remains patent. 2. Small retropharyngeal effusion further mildly narrowing the nasopharyngeal and oropharyngeal airway without evidence of peripheral enhancement to suggest retropharyngeal abscess or extension into the mediastinum. Electronically Signed   By: Lesia Hausen M.D.   On: 01/28/2021 16:45   MR Brain W and Wo Contrast  Result Date: 01/28/2021 CLINICAL DATA:  Neuro deficit, acute, stroke suspected EXAM: MRI HEAD WITHOUT AND WITH CONTRAST TECHNIQUE: Multiplanar, multiecho pulse sequences of the brain and surrounding structures were obtained without and with intravenous contrast. CONTRAST:  5mL GADAVIST GADOBUTROL 1 MMOL/ML IV SOLN COMPARISON:  None. FINDINGS: Brain: No acute infarction, hemorrhage, hydrocephalus, extra-axial collection or mass lesion. No abnormal intracranial enhancement. Vascular: Major arterial flow voids are maintained at the skull base. Skull and upper cervical spine: Normal marrow signal. Sinuses/Orbits: Moderate left maxillary sinus mucosal thickening. Otherwise, mild paranasal sinus mucosal thickening. Unremarkable orbits. Other: Abnormal edema,  enhancement, and soft tissue thickening involving the left skull base and visualized prevertebral and paraspinal soft tissues. This involves the left prevertebral musculature, left prevertebral parapharyngeal and carotid spaces. The left tonsil and adenoid is displaced anteriorly with some narrowing of the airway. On sagittal imaging there is evidence of prevertebral soft tissue thickening extending inferiorly into the upper neck, incompletely imaged. Discrete 1 x 0.7 x 1.2 Cm peripherally enhancing fluid collection in the left prevertebral prevertebral space, immediately adjacent to the left carotid space (series 9, image 30 series 7, image 16). No sizable mastoid effusions. The left internal jugular vein is completely effaced in this region. The jugular bulb and intracranial dural venous sinuses are patent. IMPRESSION: 1. Abnormal edema, enhancement, and soft tissue thickening involving the left skull base and upper neck, detailed above and concerning for a severe deep neck infection. Discrete 1.2 cm peripherally enhancing fluid collection in the left prevertebral space is concerning for an abscess. Given findings are incompletely imaged, recommend CT neck with contrast. 2. No evidence  of acute intracranial abnormality.  No acute infarct. Electronically Signed   By: Feliberto Harts M.D.   On: 01/28/2021 15:16   DG Chest Port 1 View  Result Date: 01/28/2021 CLINICAL DATA:  Fever, cough EXAM: PORTABLE CHEST 1 VIEW COMPARISON:  None. FINDINGS: The cardiomediastinal silhouette is normal. The lungs are clear, without focal consolidation or pulmonary edema. There is no pleural effusion or pneumothorax. There is no acute osseous abnormality. IMPRESSION: No radiographic evidence of acute cardiopulmonary process. Electronically Signed   By: Lesia Hausen M.D.   On: 01/28/2021 16:24   _______________________________________________________________________________________________________ Latest  Blood pressure 118/77,  pulse 93, temperature 98.7 F (37.1 C), temperature source Oral, resp. rate 16, height  (1.575 m), weight 49.9 kg, last menstrual period 01/14/2021, SpO2 98 %.   Review of Systems:    Pertinent positives include:  chills, fatigue neck swelling/stiffness  Constitutional:  No weight loss, night sweats, Fevers, , weight loss  HEENT:  No headaches, Difficulty swallowing,Tooth/dental problems,Sore throat,  No sneezing, itching, ear ache, nasal congestion, post nasal drip,  Cardio-vascular:  No chest pain, Orthopnea, PND, anasarca, dizziness, palpitations.no Bilateral lower extremity swelling  GI:  No heartburn, indigestion, abdominal pain, nausea, vomiting, diarrhea, change in bowel habits, loss of appetite, melena, blood in stool, hematemesis Resp:  no shortness of breath at rest. No dyspnea on exertion, No excess mucus, no productive cough, No non-productive cough, No coughing up of blood.No change in color of mucus.No wheezing. Skin:  no rash or lesions. No jaundice GU:  no dysuria, change in color of urine, no urgency or frequency. No straining to urinate.  No flank pain.  Musculoskeletal:  No joint pain or no joint swelling. No decreased range of motion. No back pain.  Psych:  No change in mood or affect. No depression or anxiety. No memory loss.  Neuro: no localizing neurological complaints, no tingling, no weakness, no double vision, no gait abnormality, no slurred speech, no confusion  All systems reviewed and apart from HOPI all are negative _______________________________________________________________________________________________ Past Medical History:  History reviewed. No pertinent past medical history.    Past Surgical History:  Procedure Laterality Date   INCISION AND DRAINAGE OF PERITONSILLAR ABCESS N/A 08/03/2016   Procedure: INCISION AND DRAINAGE OF PERITONSILLAR ABCESS;  Surgeon: Flo Shanks, MD;  Location: [facility] OR;  Service: ENT;  Laterality: N/A;    TONSILLECTOMY AND ADENOIDECTOMY Bilateral 12/19/2017   Procedure: TONSILLECTOMY AND ADENOIDECTOMY;  Surgeon: Flo Shanks, MD;  Location: [facility] OR;  Service: ENT;  Laterality: Bilateral;   TYMPANOSTOMY TUBE PLACEMENT      Social History:  Ambulatory  independently      reports that she has never smoked. She has never used smokeless tobacco. She reports current alcohol use. No history on file for drug use.     Family History:   Family History  Problem Relation Age of Onset   CAD Neg Hx    Diabetes Neg Hx    ______________________________________________________________________________________________ Allergies: Allergies  Allergen Reactions   Norelgestromin-Eth Estradiol Dermatitis     Prior to Admission medications   Medication Sig Start Date End Date Taking? Authorizing Provider  aspirin-acetaminophen-caffeine (EXCEDRIN MIGRAINE) [phone] MG tablet Take 1 tablet by mouth every 6 (six) hours as needed for headache.   Yes [provider]  AUROVELA 24 FE 1-20 MG-MCG(24) tablet Take 1 tablet by mouth daily. 03/30/20  Yes [provider]  ibuprofen (ADVIL) 200 MG tablet Take 200 mg by mouth every 6 (six) hours as needed for  fever, headache or mild pain.   Yes [provider]  methylphenidate (CONCERTA) 36 MG PO CR tablet Take 2 tablets (72 mg total) by mouth every morning. Patient taking differently: Take 36 mg by mouth daily. 12/31/20  Yes Crump, Bobi A, NP  Multiple Vitamin (MULTIVITAMIN WITH MINERALS) TABS tablet Take 1 tablet by mouth daily.   Yes [provider]  predniSONE (DELTASONE) 20 MG tablet Take 40 mg by mouth daily. 01/26/21  Yes [provider]    ___________________________________________________________________________________________________ Physical Exam: Vitals with BMI 01/28/2021 01/28/2021 01/28/2021  Height - - -  Weight - - -  BMI - - -  Systolic 118 123 829  Diastolic 77 81 85  Pulse 93 84 90     1.  General:  in No  Acute distress   well   -appearing 2. Psychological: Alert and   Oriented 3. Head/ENT:    Dry Mucous Membranes                          Head Non traumatic, neck supple                          Normal  Dentition 4. SKIN decreased Skin turgor,  Skin clean Dry and intact no rash 5. Heart: Regular rate and rhythm no  Murmur, no Rub or gallop 6. Lungs:  Clear to auscultation bilaterally, no wheezes or crackles   7. Abdomen: Soft,  non-tender, Non distended  bowel sounds present 8. Lower extremities: no clubbing, cyanosis, no  edema 9. Neurologically Grossly intact, moving all 4 extremities equally   10. MSK: Normal range of motion    Chart has been reviewed  ______________________________________________________________________________________________  Assessment/Plan 20 y.o. female with medical history significant of tonsillitis, peritonsil abscess, tonsillectomy, ADHD   Admitted for retropharyngeal abcsess  Present on Admission: Not meeting sepsis criteria on admission   Retropharyngeal abscess ENT aware will see patient in consult feels that there is no indication for acute surgical intervention tonight.  Recommend 1 dose of steroids and then clindamycin 600 every 8 likely needs to be followed for 3 days .  ADHD (attention deficit hyperactivity disorder), inattentive type hold off on home medications for now  Leukocytosis in the setting of recent steroid use as well as a retropharyngeal abscess but no associated fever or other vital sign changes  Other plan as per orders.  DVT prophylaxis:  SCD      Code Status:    Code Status: Not on file FULL CODE  as per patient    I had personally discussed CODE STATUS with patient     Family Communication:   Family not at  Bedside    Disposition Plan:      To home once workup is complete and patient is stable   Following barriers for discharge:                                                     Pain controlled with  PO medications                               Afebrile, white count improving able to transition to PO antibiotics  Will need to be able to tolerate PO                                                    Will need consultants to evaluate patient prior to discharge    Consults called: ENT  Admission status:  ED Disposition     ED Disposition  Admit   Condition  --   Comment  Hospital Area: [facility] [[id number]]  Level of Care: Telemetry [5]  Admit to tele based on following criteria: Other see comments  Comments: sepsis  May admit patient to [facility] or [facility] if equivalent level of care is available:: No  Covid Evaluation: Confirmed COVID Negative  Diagnosis: Retropharyngeal abscess [478.24.ICD-9-CM]  Admitting Physician: Therisa Doyne [[id number]]  Attending Physician: Therisa Doyne [[id number]]  Estimated length of stay: past midnight tomorrow  Certification:: I certify this patient will need inpatient services for at least 2 midnights           inpatient     I Expect 2 midnight stay secondary to severity of patient's current illness need for inpatient interventions justified by the following:    Severe lab/radiological/exam abnormalities including:   Retropharyngeal abscess    That are currently affecting medical management.   I expect  patient to be hospitalized for 2 midnights requiring inpatient medical care.  Patient is at high risk for adverse outcome (such as loss of life or disability) if not treated.  Indication for inpatient stay as follows:     severe pain requiring acute inpatient management,  inability to maintain oral hydration    Need for operative/procedural  intervention    Need for IV antibiotics, IV fluids,      Level of care    tele  For 12H     Lab Results  Component Value Date   SARSCOV2NAA NEGATIVE 01/28/2021     Precautions: admitted as Covid Negative   PPE: Used by the  provider:   N95  eye Goggles,  Gloves        Shandy Vi 01/29/2021, 12:24 AM    [facility]     after 2 AM please page floor coverage PA If 7AM-7PM, please contact the day team taking care of the patient using [email or web address]   Patient was evaluated in the context of the global COVID-19 pandemic, which necessitated consideration that the patient might be at risk for infection with the SARS-CoV-2 virus that causes COVID-19. Institutional protocols and algorithms that pertain to the evaluation of patients at risk for COVID-19 are in a state of rapid change based on information released by regulatory bodies including the CDC and federal and state organizations. These policies and algorithms were followed during the patient's care.

## 2021-01-28 NOTE — Discharge Instructions (Signed)
The testing today is reassuring.  There is no sign of meningitis on the lumbar puncture.  You likely have a virus.  Check the results for the respiratory panel, by looking at care length later today or tomorrow.  In the meantime treat symptoms for UTI with fluids, rest, and ibuprofen.  You do not need to continue taking the prednisone, for concerning symptoms for concerning symptoms.

## 2021-01-29 ENCOUNTER — Encounter (HOSPITAL_COMMUNITY): Payer: Self-pay | Admitting: Internal Medicine

## 2021-01-29 DIAGNOSIS — F900 Attention-deficit hyperactivity disorder, predominantly inattentive type: Secondary | ICD-10-CM

## 2021-01-29 DIAGNOSIS — J390 Retropharyngeal and parapharyngeal abscess: Principal | ICD-10-CM

## 2021-01-29 DIAGNOSIS — D72829 Elevated white blood cell count, unspecified: Secondary | ICD-10-CM | POA: Diagnosis present

## 2021-01-29 LAB — HIV ANTIBODY (ROUTINE TESTING W REFLEX): HIV Screen 4th Generation wRfx: NONREACTIVE

## 2021-01-29 LAB — CBC WITH DIFFERENTIAL/PLATELET
Abs Immature Granulocytes: 0.06 10*3/uL (ref 0.00–0.07)
Basophils Absolute: 0 10*3/uL (ref 0.0–0.1)
Basophils Relative: 0 %
Eosinophils Absolute: 0 10*3/uL (ref 0.0–0.5)
Eosinophils Relative: 0 %
HCT: 33.7 % — ABNORMAL LOW (ref 36.0–46.0)
Hemoglobin: 11.2 g/dL — ABNORMAL LOW (ref 12.0–15.0)
Immature Granulocytes: 1 %
Lymphocytes Relative: 10 %
Lymphs Abs: 1.3 10*3/uL (ref 0.7–4.0)
MCH: 28.4 pg (ref 26.0–34.0)
MCHC: 33.2 g/dL (ref 30.0–36.0)
MCV: 85.5 fL (ref 80.0–100.0)
Monocytes Absolute: 0.6 10*3/uL (ref 0.1–1.0)
Monocytes Relative: 4 %
Neutro Abs: 11.3 10*3/uL — ABNORMAL HIGH (ref 1.7–7.7)
Neutrophils Relative %: 85 %
Platelets: 219 10*3/uL (ref 150–400)
RBC: 3.94 MIL/uL (ref 3.87–5.11)
RDW: 13.1 % (ref 11.5–15.5)
WBC: 13.3 10*3/uL — ABNORMAL HIGH (ref 4.0–10.5)
nRBC: 0 % (ref 0.0–0.2)

## 2021-01-29 LAB — COMPREHENSIVE METABOLIC PANEL
ALT: 17 U/L (ref 0–44)
AST: 16 U/L (ref 15–41)
Albumin: 3.2 g/dL — ABNORMAL LOW (ref 3.5–5.0)
Alkaline Phosphatase: 38 U/L (ref 38–126)
Anion gap: 9 (ref 5–15)
BUN: 11 mg/dL (ref 6–20)
CO2: 23 mmol/L (ref 22–32)
Calcium: 8.8 mg/dL — ABNORMAL LOW (ref 8.9–10.3)
Chloride: 104 mmol/L (ref 98–111)
Creatinine, Ser: 0.64 mg/dL (ref 0.44–1.00)
GFR, Estimated: >60 mL/min (ref >60)
Glucose, Bld: 122 mg/dL — ABNORMAL HIGH (ref 70–99)
Potassium: 4 mmol/L (ref 3.5–5.1)
Sodium: 136 mmol/L (ref 135–145)
Total Bilirubin: 0.5 mg/dL (ref 0.3–1.2)
Total Protein: 7 g/dL (ref 6.5–8.1)

## 2021-01-29 LAB — URINE CULTURE: Culture: <10000 — AB

## 2021-01-29 LAB — PHOSPHORUS: Phosphorus: 3.9 mg/dL (ref 2.5–4.6)

## 2021-01-29 LAB — MAGNESIUM: Magnesium: 1.9 mg/dL (ref 1.7–2.4)

## 2021-01-29 LAB — TSH: TSH: 0.342 u[IU]/mL — ABNORMAL LOW (ref 0.350–4.500)

## 2021-01-29 MED ORDER — CLINDAMYCIN HCL 300 MG PO CAPS: 300.0000 mg | ORAL_CAPSULE | Freq: Three times a day (TID) | ORAL | 0 refills | Status: AC

## 2021-01-29 NOTE — Progress Notes (Signed)
Pt discharged to home. DC instructions given. No concerns voiced.left unit in wheelchair pushed by nurse tech, Corrie Dandy. Left in stable condition.  VWilliams,RN.

## 2021-01-29 NOTE — Plan of Care (Signed)

## 2021-01-29 NOTE — Consult Note (Addendum)
Reason for Consult: Retropharyngeal abscess Referring Physician: Chaney Malling, MD  HPI:  [name] is an 20 y.o. female who was admitted yesterday for treatment of her retropharyngeal abscess. The patient was complaining of left-sided headache and left-sided neck pain, onset several days ago. Her symptoms persisted despite treatment with prednisone for "URI."  Diagnosed with URI after evaluation in urgent care where she had a COVID test which was negative.  She has been vaccinated against COVID.  She is here with her mother.  She is a Archivist.  She does not have current sinus congestion or drainage.  She denies cough.  She has had some chills but no documented fever.  She denies nausea, vomiting, dizziness, difficulty walking, near syncope or paresthesia.  No prior similar problems.  No known sick contacts. Her CT scan shows 1.4 cm abscess vs suppurative lateral retropharyngeal node in the lateral left retropharyngeal space. She was treated with IV steroid and IV clindamycin. She reports significant improvement in her neck stiffness after treatment with clindamycin over the past day.  History reviewed. No pertinent past medical history.  Past Surgical History:  Procedure Laterality Date   INCISION AND DRAINAGE OF PERITONSILLAR ABCESS N/A 08/03/2016   Procedure: INCISION AND DRAINAGE OF PERITONSILLAR ABCESS;  Surgeon: Flo Shanks, MD;  Location: [facility] OR;  Service: ENT;  Laterality: N/A;   TONSILLECTOMY AND ADENOIDECTOMY Bilateral 12/19/2017   Procedure: TONSILLECTOMY AND ADENOIDECTOMY;  Surgeon: Flo Shanks, MD;  Location: [facility] OR;  Service: ENT;  Laterality: Bilateral;   TYMPANOSTOMY TUBE PLACEMENT      Family History  Problem Relation Age of Onset   CAD Neg Hx    Diabetes Neg Hx     Social History:  reports that she has never smoked. She has never used smokeless tobacco. She reports current alcohol use. No history on file for drug use.  Allergies:  Allergies  Allergen Reactions    Norelgestromin-Eth Estradiol Dermatitis    Prior to Admission medications   Medication Sig Start Date End Date Taking? Authorizing Provider  aspirin-acetaminophen-caffeine (EXCEDRIN MIGRAINE) [phone] MG tablet Take 1 tablet by mouth every 6 (six) hours as needed for headache.   Yes [provider]  AUROVELA 24 FE 1-20 MG-MCG(24) tablet Take 1 tablet by mouth daily. 03/30/20  Yes [provider]  ibuprofen (ADVIL) 200 MG tablet Take 200 mg by mouth every 6 (six) hours as needed for fever, headache or mild pain.   Yes [provider]  methylphenidate (CONCERTA) 36 MG PO CR tablet Take 2 tablets (72 mg total) by mouth every morning. Patient taking differently: Take 36 mg by mouth daily. 12/31/20  Yes Crump, Bobi A, NP  Multiple Vitamin (MULTIVITAMIN WITH MINERALS) TABS tablet Take 1 tablet by mouth daily.   Yes [provider]  predniSONE (DELTASONE) 20 MG tablet Take 40 mg by mouth daily. 01/26/21  Yes [provider]    Medications: I have reviewed the patient's current medications. Scheduled: Continuous:  clindamycin (CLEOCIN) IV 600 mg (01/29/21 1428)    Results for orders placed or performed during the hospital encounter of 01/28/21 (from the past 48 hour(s))  Comprehensive metabolic panel     Status: Abnormal   Collection Time: 01/28/21  7:45 AM  Result Value Ref Range   Sodium 137 135 - 145 mmol/L   Potassium 3.5 3.5 - 5.1 mmol/L   Chloride 108 98 - 111 mmol/L   CO2 22 22 - 32 mmol/L   Glucose, Bld 113 (H) 70 -  99 mg/dL    Comment: Glucose reference range applies only to samples taken after fasting for at least 8 hours.   BUN 7 6 - 20 mg/dL   Creatinine, Ser 2.87 0.44 - 1.00 mg/dL   Calcium 9.3 8.9 - 68.1 mg/dL   Total Protein 8.3 (H) 6.5 - 8.1 g/dL   Albumin 3.7 3.5 - 5.0 g/dL   AST 17 15 - 41 U/L   ALT 16 0 - 44 U/L   Alkaline Phosphatase 42 38 - 126 U/L   Total Bilirubin 0.6 0.3 - 1.2 mg/dL   GFR, Estimated >15 >72 mL/min     Comment: (NOTE) Calculated using the CKD-EPI Creatinine Equation (2021)    Anion gap 7 5 - 15    Comment: Performed at [facility], [address] [place], [place] [address]  CBC with Differential     Status: Abnormal   Collection Time: 01/28/21  7:45 AM  Result Value Ref Range   WBC 20.8 (H) 4.0 - 10.5 K/uL   RBC 4.52 3.87 - 5.11 MIL/uL   Hemoglobin 13.0 12.0 - 15.0 g/dL   HCT 59.7 41.6 - 38.4 %   MCV 86.3 80.0 - 100.0 fL   MCH 28.8 26.0 - 34.0 pg   MCHC 33.3 30.0 - 36.0 g/dL   RDW 53.6 46.8 - 03.2 %   Platelets 223 150 - 400 K/uL   nRBC 0.0 0.0 - 0.2 %   Neutrophils Relative % 85 %   Neutro Abs 17.5 (H) 1.7 - 7.7 K/uL   Lymphocytes Relative 9 %   Lymphs Abs 1.9 0.7 - 4.0 K/uL   Monocytes Relative 6 %   Monocytes Absolute 1.2 (H) 0.1 - 1.0 K/uL   Eosinophils Relative 0 %   Eosinophils Absolute 0.0 0.0 - 0.5 K/uL   Basophils Relative 0 %   Basophils Absolute 0.0 0.0 - 0.1 K/uL   Immature Granulocytes 0 %   Abs Immature Granulocytes 0.08 (H) 0.00 - 0.07 K/uL    Comment: Performed at [facility], [address] [place], [place] [address]  Resp Panel by RT-PCR (Flu A&B, Covid) Nasopharyngeal Swab     Status: None   Collection Time: 01/28/21  7:45 AM   Specimen: Nasopharyngeal Swab; Nasopharyngeal(NP) swabs in vial transport medium  Result Value Ref Range   SARS Coronavirus 2 by RT PCR NEGATIVE NEGATIVE    Comment: (NOTE) SARS-CoV-2 target nucleic acids are NOT DETECTED.  The SARS-CoV-2 RNA is generally detectable in upper respiratory specimens during the acute phase of infection. The lowest concentration of SARS-CoV-2 viral copies this assay can detect is 138 copies/mL. A negative result does not preclude SARS-Cov-2 infection and should not be used as the sole basis for treatment or other patient management decisions. A negative result may occur with  improper specimen collection/handling, submission of specimen other than  nasopharyngeal swab, presence of viral mutation(s) within the areas targeted by this assay, and inadequate number of viral copies(<138 copies/mL). A negative result must be combined with clinical observations, patient history, and epidemiological information. The expected result is Negative.  Fact Sheet for Patients:  BloggerCourse.com  Fact Sheet for Healthcare Providers:  SeriousBroker.it  This test is no t yet approved or cleared by the [place] FDA and  has been authorized for detection and/or diagnosis of SARS-CoV-2 by FDA under an Emergency Use Authorization (EUA). This EUA will remain  in effect (meaning this test can be used) for the duration of the COVID-19  declaration under Section 564(b)(1) of the Act, 21 U.S.C.section 360bbb-3(b)(1), unless the authorization is terminated  or revoked sooner.       Influenza A by PCR NEGATIVE NEGATIVE   Influenza B by PCR NEGATIVE NEGATIVE    Comment: (NOTE) The Xpert Xpress SARS-CoV-2/FLU/RSV plus assay is intended as an aid in the diagnosis of influenza from Nasopharyngeal swab specimens and should not be used as a sole basis for treatment. Nasal washings and aspirates are unacceptable for Xpert Xpress SARS-CoV-2/FLU/RSV testing.  Fact Sheet for Patients: BloggerCourse.com  Fact Sheet for Healthcare Providers: SeriousBroker.it  This test is not yet approved or cleared by the [place] FDA and has been authorized for detection and/or diagnosis of SARS-CoV-2 by FDA under an Emergency Use Authorization (EUA). This EUA will remain in effect (meaning this test can be used) for the duration of the COVID-19 declaration under Section 564(b)(1) of the Act, 21 U.S.C. section 360bbb-3(b)(1), unless the authorization is terminated or revoked.  Performed at [facility], [address], [place], [place]  [address]   Respiratory (~20 pathogens) panel by PCR     Status: None   Collection Time: 01/28/21  7:45 AM   Specimen: Nasopharyngeal Swab; Respiratory  Result Value Ref Range   Adenovirus NOT DETECTED NOT DETECTED   Coronavirus 229E NOT DETECTED NOT DETECTED    Comment: (NOTE) The Coronavirus on the Respiratory Panel, DOES NOT test for the novel  Coronavirus (2019 nCoV)    Coronavirus HKU1 NOT DETECTED NOT DETECTED   Coronavirus NL63 NOT DETECTED NOT DETECTED   Coronavirus OC43 NOT DETECTED NOT DETECTED   Metapneumovirus NOT DETECTED NOT DETECTED   Rhinovirus / Enterovirus NOT DETECTED NOT DETECTED   Influenza A NOT DETECTED NOT DETECTED   Influenza B NOT DETECTED NOT DETECTED   Parainfluenza Virus 1 NOT DETECTED NOT DETECTED   Parainfluenza Virus 2 NOT DETECTED NOT DETECTED   Parainfluenza Virus 3 NOT DETECTED NOT DETECTED   Parainfluenza Virus 4 NOT DETECTED NOT DETECTED   Respiratory Syncytial Virus NOT DETECTED NOT DETECTED   Bordetella pertussis NOT DETECTED NOT DETECTED   Bordetella Parapertussis NOT DETECTED NOT DETECTED   Chlamydophila pneumoniae NOT DETECTED NOT DETECTED   Mycoplasma pneumoniae NOT DETECTED NOT DETECTED    Comment: Performed at [facility], [address] [place], [place] [address]  Lactic acid, plasma     Status: None   Collection Time: 01/28/21  7:45 AM  Result Value Ref Range   Lactic Acid, Venous 0.7 0.5 - 1.9 mmol/L    Comment: Performed at [facility], [address], [place], [place] [address]  Culture, blood (routine x 2)     Status: None (Preliminary result)   Collection Time: 01/28/21  7:48 AM   Specimen: BLOOD  Result Value Ref Range   Specimen Description      BLOOD LEFT ANTECUBITAL Performed at [facility], [address] [place], [place] [address]    Special Requests      BOTTLES DRAWN AEROBIC AND ANAEROBIC Blood Culture adequate volume Performed at [facility], [address] [place], [place] [address]    Culture      NO GROWTH 1 DAY Performed at [facility], [address], [place], [place] [address]    Report Status PENDING   Urine Culture     Status: Abnormal   Collection Time: 01/28/21  9:55 AM   Specimen: Urine, Clean Catch  Result Value  Ref Range   Specimen Description      URINE, CLEAN CATCH Performed at [facility], [address], [place], [place] [address]    Special Requests      NONE Performed at [facility], [address], [place], [place] [address]    Culture (A)     <10,000 COLONIES/mL INSIGNIFICANT GROWTH Performed at [facility], [address], [place], [place] [address]    Report Status 01/29/2021 FINAL   Urinalysis, Routine w reflex microscopic Urine, Clean Catch     Status: Abnormal   Collection Time: 01/28/21  9:57 AM  Result Value Ref Range   Color, Urine YELLOW YELLOW   APPearance CLEAR CLEAR   Specific Gravity, Urine 1.005 1.005 - 1.030   pH 7.0 5.0 - 8.0   Glucose, UA NEGATIVE NEGATIVE mg/dL   Hgb urine dipstick NEGATIVE NEGATIVE   Bilirubin Urine NEGATIVE NEGATIVE   Ketones, ur NEGATIVE NEGATIVE mg/dL   Protein, ur NEGATIVE NEGATIVE mg/dL   Nitrite NEGATIVE NEGATIVE   Leukocytes,Ua MODERATE (A) NEGATIVE   RBC / HPF 6-10 0 - 5 RBC/hpf   WBC, UA 11-20 0 - 5 WBC/hpf   Bacteria, UA RARE (A) NONE SEEN   Squamous Epithelial / LPF 6-10 0 - 5    Comment: Performed at [facility], [address], [place], [place] [address]  CSF cell count with differential collection tube #: 1     Status: Abnormal   Collection Time: 01/28/21 10:23 AM  Result Value Ref Range   Tube # 1    Color, CSF COLORLESS COLORLESS   Appearance, CSF CLEAR (A) CLEAR   Supernatant NOT INDICATED    RBC Count, CSF 2 (H) 0 /cu mm   WBC, CSF 1 0 - 5 /cu mm   Other Cells, CSF TOO FEW TO COUNT, SMEAR AVAILABLE FOR REVIEW     Comment: RARE LYMPHOCYTES AND MONOCYTES  PRESENT. Performed at [facility], [address] [place], [place] [address]   CSF cell count with differential collection tube #: 4     Status: Abnormal   Collection Time: 01/28/21 10:23 AM  Result Value Ref Range   Tube # 4    Color, CSF COLORLESS COLORLESS   Appearance, CSF CLEAR (A) CLEAR   Supernatant NOT INDICATED    RBC Count, CSF 1 (H) 0 /cu mm   WBC, CSF 1 0 - 5 /cu mm   Other Cells, CSF TOO FEW TO COUNT, SMEAR AVAILABLE FOR REVIEW     Comment: RARE MONOCYTES PRESENT. Performed at [facility], [address], [place], [place] [address]   CSF culture     Status: None (Preliminary result)   Collection Time: 01/28/21 10:23 AM   Specimen: Back; Cerebrospinal Fluid  Result Value Ref Range   Specimen Description      BACK CSF Performed at [facility], [address], [place], [place] [address]    Special Requests      NONE Performed at [facility], [address], [place], [place] [address]    Gram Stain      NO WBC SEEN NO ORGANISMS SEEN Gram Stain Report Called to,Read Back By and Verified With: MCIVER,M. RN @1216  01/28/2021 NMCKOY Performed at [facility], [address], [place], [place]    Culture      NO GROWTH < 24 HOURS Performed at [facility], [address] [place], [place] [address]    Report Status PENDING   Glucose, CSF     Status: None   Collection Time: 01/28/21 10:23 AM  Result Value Ref Range   Glucose, CSF 70 40 - 70 mg/dL    Comment: Performed at [facility], [address] [place], [place] [address]  Protein, CSF     Status: None   Collection Time: 01/28/21 10:23 AM  Result Value Ref Range   Total  Protein, CSF 24 15 - 45 mg/dL    Comment: Performed at [facility], [address], [place], [place] [address]  Culture, blood (routine x 2)     Status: None (Preliminary result)   Collection  Time: 01/28/21  8:58 PM   Specimen: BLOOD  Result Value Ref Range   Specimen Description      BLOOD BLOOD RIGHT ARM Performed at [facility], [address] [place], [place] [address]    Special Requests      BOTTLES DRAWN AEROBIC ONLY Blood Culture adequate volume Performed at [facility], [address], [place], [place] [address]    Culture      NO GROWTH < 12 HOURS Performed at [facility], [address], [place], [place] [address]    Report Status PENDING   HIV Antibody (routine testing w rflx)     Status: None   Collection Time: 01/29/21  5:58 AM  Result Value Ref Range   HIV Screen 4th Generation wRfx Non Reactive Non Reactive    Comment: Performed at [facility], [address], [place], [place] [address]  Magnesium     Status: None   Collection Time: 01/29/21  5:58 AM  Result Value Ref Range   Magnesium 1.9 1.7 - 2.4 mg/dL    Comment: Performed at [facility], [address] [place], [place] [address]  Phosphorus     Status: None   Collection Time: 01/29/21  5:58 AM  Result Value Ref Range   Phosphorus 3.9 2.5 - 4.6 mg/dL    Comment: Performed at [facility], [address], [place], [place] [address]  CBC WITH DIFFERENTIAL     Status: Abnormal   Collection Time: 01/29/21  5:58 AM  Result Value Ref Range   WBC 13.3 (H) 4.0 - 10.5 K/uL   RBC 3.94 3.87 - 5.11 MIL/uL   Hemoglobin 11.2 (L) 12.0 - 15.0 g/dL   HCT 74.2 (L) 59.5 - 63.8 %   MCV 85.5 80.0 - 100.0 fL   MCH 28.4 26.0 - 34.0 pg   MCHC 33.2 30.0 - 36.0 g/dL   RDW 75.6 43.3 - 29.5 %   Platelets 219 150 - 400 K/uL   nRBC 0.0 0.0 - 0.2 %   Neutrophils Relative % 85 %   Neutro Abs 11.3 (H) 1.7 - 7.7 K/uL   Lymphocytes Relative 10 %   Lymphs Abs 1.3 0.7 - 4.0 K/uL   Monocytes Relative 4 %   Monocytes Absolute 0.6 0.1 - 1.0 K/uL   Eosinophils Relative 0 %   Eosinophils Absolute 0.0 0.0 - 0.5 K/uL   Basophils  Relative 0 %   Basophils Absolute 0.0 0.0 - 0.1 K/uL   Immature Granulocytes 1 %   Abs Immature Granulocytes 0.06 0.00 - 0.07 K/uL    Comment: Performed at [facility], [address], [place], [place] [address]  TSH  Status: Abnormal   Collection Time: 01/29/21  5:58 AM  Result Value Ref Range   TSH 0.342 (L) 0.350 - 4.500 uIU/mL    Comment: Performed by a 3rd Generation assay with a functional sensitivity of <=0.01 uIU/mL. Performed at [facility], [address], [place], [place] [address]   Comprehensive metabolic panel     Status: Abnormal   Collection Time: 01/29/21  5:58 AM  Result Value Ref Range   Sodium 136 135 - 145 mmol/L   Potassium 4.0 3.5 - 5.1 mmol/L   Chloride 104 98 - 111 mmol/L   CO2 23 22 - 32 mmol/L   Glucose, Bld 122 (H) 70 - 99 mg/dL    Comment: Glucose reference range applies only to samples taken after fasting for at least 8 hours.   BUN 11 6 - 20 mg/dL   Creatinine, Ser 4.09 0.44 - 1.00 mg/dL   Calcium 8.8 (L) 8.9 - 10.3 mg/dL   Total Protein 7.0 6.5 - 8.1 g/dL   Albumin 3.2 (L) 3.5 - 5.0 g/dL   AST 16 15 - 41 U/L   ALT 17 0 - 44 U/L   Alkaline Phosphatase 38 38 - 126 U/L   Total Bilirubin 0.5 0.3 - 1.2 mg/dL   GFR, Estimated >81 >19 mL/min    Comment: (NOTE) Calculated using the CKD-EPI Creatinine Equation (2021)    Anion gap 9 5 - 15    Comment: Performed at [facility], [address], [place], [place] [address]    CT Head Wo Contrast  Result Date: 01/28/2021 CLINICAL DATA:  Left side headache EXAM: CT HEAD WITHOUT CONTRAST TECHNIQUE: Contiguous axial images were obtained from the base of the skull through the vertex without intravenous contrast. COMPARISON:  None. FINDINGS: Brain: No acute intracranial abnormality. Specifically, no hemorrhage, hydrocephalus, mass lesion, acute infarction, or significant intracranial injury. Vascular: No hyperdense vessel or unexpected calcification.  Skull: No acute calvarial abnormality. Sinuses/Orbits: No acute findings Other: None IMPRESSION: Normal study. Electronically Signed   By: Charlett Nose M.D.   On: 01/28/2021 08:47   CT Soft Tissue Neck W Contrast  Result Date: 01/28/2021 CLINICAL DATA:  Left-sided neck pain, abscess EXAM: CT NECK WITH CONTRAST TECHNIQUE: Multidetector CT imaging of the neck was performed using the standard protocol following the bolus administration of intravenous contrast. CONTRAST:  60mL OMNIPAQUE IOHEXOL 350 MG/ML SOLN COMPARISON:  Same-day head CT and brain MRI FINDINGS: Pharynx and larynx: The nasal cavity is normal. There is slight asymmetric effacement of the left nasopharynx. There is a 1.4 cm x 1.0 cm by 1.4 cm peripherally enhancing collection in the lateral left retropharyngeal space consistent with an abscess. There is thickening of the adjacent soft tissues in the carotid and parapharyngeal spaces. The collection abuts the medial aspect of the internal carotid artery which is patent. There is mild stranding in the adjacent parapharyngeal fat. Mass effect from the collection and swelling effaces the internal jugular vein. The jugular bulb and internal jugular vein below the level of the collection is patent. A small locule of air within the left internal jugular vein is most likely due to intravenous access. There is a small retropharyngeal effusion measuring up to 5 mm in thickness resulting in further mild effacement of the nasopharyngeal airway and mild effacement of the oropharyngeal airway. There is no peripheral enhancement to suggest additional retropharyngeal abscess. This collection extends to the C5-C6 level without evidence of extension into the mediastinum. The oral cavity and  oropharynx are otherwise normal. The hypopharynx and larynx are normal. Salivary glands: The parotid and submandibular glands are unremarkable. Thyroid: Unremarkable. Lymph nodes: There are prominent left-sided cervical chain lymph  nodes measuring up to 1.0 cm in level II, likely reactive. Vascular: The left-sided vasculature as described above. The right vasculature is unremarkable. Limited intracranial: The intracranial compartment as assessed on the separately dictated brain MRI. Visualized orbits: The imaged globes and orbits are unremarkable. Mastoids and visualized paranasal sinuses: There is mild mucosal thickening in the left maxillary sinus. The imaged mastoid air cells are clear. Skeleton: The bones are unremarkable. Upper chest: The lung apices are clear. Other: None. IMPRESSION: 1. 1.4 cm x 1.0 cm x 1.4 cm abscess vs suppurative lateral retropharyngeal node in the lateral left retropharyngeal space with surrounding soft tissue thickening and inflammatory change resulting in mild narrowing of the nasopharyngeal airway. The collection abuts the medial aspect of the internal carotid artery which remains patent. 2. Small retropharyngeal effusion further mildly narrowing the nasopharyngeal and oropharyngeal airway without evidence of peripheral enhancement to suggest retropharyngeal abscess or extension into the mediastinum. Electronically Signed   By: Lesia Hausen M.D.   On: 01/28/2021 16:45   MR Brain W and Wo Contrast  Result Date: 01/28/2021 CLINICAL DATA:  Neuro deficit, acute, stroke suspected EXAM: MRI HEAD WITHOUT AND WITH CONTRAST TECHNIQUE: Multiplanar, multiecho pulse sequences of the brain and surrounding structures were obtained without and with intravenous contrast. CONTRAST:  34mL GADAVIST GADOBUTROL 1 MMOL/ML IV SOLN COMPARISON:  None. FINDINGS: Brain: No acute infarction, hemorrhage, hydrocephalus, extra-axial collection or mass lesion. No abnormal intracranial enhancement. Vascular: Major arterial flow voids are maintained at the skull base. Skull and upper cervical spine: Normal marrow signal. Sinuses/Orbits: Moderate left maxillary sinus mucosal thickening. Otherwise, mild paranasal sinus mucosal thickening.  Unremarkable orbits. Other: Abnormal edema, enhancement, and soft tissue thickening involving the left skull base and visualized prevertebral and paraspinal soft tissues. This involves the left prevertebral musculature, left prevertebral parapharyngeal and carotid spaces. The left tonsil and adenoid is displaced anteriorly with some narrowing of the airway. On sagittal imaging there is evidence of prevertebral soft tissue thickening extending inferiorly into the upper neck, incompletely imaged. Discrete 1 x 0.7 x 1.2 Cm peripherally enhancing fluid collection in the left prevertebral prevertebral space, immediately adjacent to the left carotid space (series 9, image 30 series 7, image 16). No sizable mastoid effusions. The left internal jugular vein is completely effaced in this region. The jugular bulb and intracranial dural venous sinuses are patent. IMPRESSION: 1. Abnormal edema, enhancement, and soft tissue thickening involving the left skull base and upper neck, detailed above and concerning for a severe deep neck infection. Discrete 1.2 cm peripherally enhancing fluid collection in the left prevertebral space is concerning for an abscess. Given findings are incompletely imaged, recommend CT neck with contrast. 2. No evidence of acute intracranial abnormality.  No acute infarct. Electronically Signed   By: Feliberto Harts M.D.   On: 01/28/2021 15:16   DG Chest Port 1 View  Result Date: 01/28/2021 CLINICAL DATA:  Fever, cough EXAM: PORTABLE CHEST 1 VIEW COMPARISON:  None. FINDINGS: The cardiomediastinal silhouette is normal. The lungs are clear, without focal consolidation or pulmonary edema. There is no pleural effusion or pneumothorax. There is no acute osseous abnormality. IMPRESSION: No radiographic evidence of acute cardiopulmonary process. Electronically Signed   By: Lesia Hausen M.D.   On: 01/28/2021 16:24    Review of Systems  All other systems reviewed and  are negative.  Blood pressure  115/69, pulse 84, temperature 98.5 F (36.9 C), temperature source Oral, resp. rate 17, height 5\' 2"  (1.575 m), weight 49.9 kg, last menstrual period 01/14/2021, SpO2 98 %. General appearance: alert, cooperative, and appears stated age Head: Normocephalic, without obvious abnormality, atraumatic Eyes: Pupils are equal, round, reactive to light. Extraocular motion is intact.  Ears: Examination of the ears shows normal auricles and external auditory canals bilaterally. Both tympanic membranes are intact.  Nose: Nasal examination shows normal mucosa, septum, turbinates.  Face: Facial examination shows no asymmetry. Palpation of the face elicit no significant tenderness.  Mouth: Oral cavity examination shows no mucosal lacerations. No significant trismus is noted.  Neck: Palpation of the neck reveals no lymphadenopathy or mass. The trachea is midline. The thyroid is not significantly enlarged.  Neuro: Cranial nerves 2-12 are all grossly in tact.   Assessment/Plan: Retropharyngeal abscess, clinically improved with IV clindamycin. - May d/c home with oral clindamycin 300mg  po QID for 10 days. - Pt may follow up with me in 1-2 weeks after discharge.  Jazir Newey W Tanajah Boulter 01/29/2021, 4:29 PM

## 2021-01-29 NOTE — Progress Notes (Signed)
[facility] PROGRESS NOTE  [name]  ATF:[id number] DOB: 2000-11-07 DOA: 01/28/2021 PCP: Marcene Corning, MD  Brief Narrative: [name] is a 20 y.o. female with a history of PTA, s/p tonsillectomy, ADHD who developed left neck pain 5 days prior that worsened involving behind the eye, throat, ear on the left in the setting of 2 weeks of URI symptoms for which she tried steroids without improvement. She presented to the ED 9/29 due to worsening symptoms, also complained of some left tongue numbness. In the ED she was afebrile with neutrophilic leukocytosis (WBC 20k). LP was performed and ruled out meningitis. Head CT, then subsequent MRI of the brain, showed no intracranial abnormalities. However, a peripherally enhancing lesion was noted concerning for deep neck infection on MRI. Subsequent CT neck soft tissue revealed 1.4 cm x 1.0 cm x 1.4 cm abscess vs suppurative lateral retropharyngeal node in the lateral left retropharyngeal space with surrounding soft tissue thickening and inflammatory change resulting in mild narrowing of the nasopharyngeal airway. The collection abuts the medial aspect of the internal carotid artery which remains patent. There was also a small retropharyngeal effusion further mildly narrowing the nasopharyngeal and oropharyngeal airway without evidence of peripheral enhancement to suggest retropharyngeal abscess or extension into the mediastinum.  ENT was consulted, formal evaluation pending, and IV clindamycin was given and the patient admitted.  Subjective: No stridor, wheezing, dyspnea, fever. Generally already feels better regarding decreased pain on the left neck/head. No abd pain, N/V/D.  Objective: BP 115/69 (BP Location: Right Arm)   Pulse 84   Temp 98.5 F (36.9 C) (Oral)   Resp 17   Ht 5\' 2"  (1.575 m)   Wt 49.9 kg   LMP 01/14/2021   SpO2 98%   BMI 20.12 kg/m   Gen: Nontoxic HEENT: Palatine tonsils absent. No visible airway intrusions.   Pulm: Clear and nonlabored on room air. No stridor. CV: RRR, no murmur, no JVD, no edema GI: Soft, NT, ND, +BS  Neuro: Alert and oriented. No focal deficits. Ext: Warm, no deformities Skin: No rashes, lesions or ulcers  Assessment & Plan: Active Problems:   ADHD (attention deficit hyperactivity disorder), inattentive type   Retropharyngeal abscess   Leukocytosis  Retropharyngeal abscess vs. suppurative lateral retropharyngeal node:  - Continue IV clindamycin, high dose. Pt tolerating po.  - ENT formal evaluation is pending.   Leukocytosis: Improved with antibiotics.   01/16/2021, MD [facility] [email or web address] 01/29/2021, 3:51 PM

## 2021-01-30 NOTE — Discharge Summary (Signed)
Physician Discharge Summary  [name] JJH:[id number] DOB: 2000-06-18 DOA: 01/28/2021  PCP: Marcene Corning, MD  Admit date: 01/28/2021 Discharge date: 01/30/2021  Admitted From: Home Disposition: Home   Recommendations for Outpatient Follow-up:  Follow up with ENT, Dr. Suszanne Conners in 1-2 weeks. Discharged on clindamycin x10 days for retropharyngeal abscess. Recommend recheck TSH at follow up. Noted to be mildly suppressed at 0.342, though this was in the setting of severe infection.   Home Health: None Equipment/Devices: None Discharge Condition: Stable CODE STATUS: Full Diet recommendation: Regular  Brief/Interim Summary: [name] is a 20 y.o. female with a history of PTA, s/p tonsillectomy, ADHD who developed left neck pain 5 days prior that worsened involving behind the eye, throat, ear on the left in the setting of 2 weeks of URI symptoms for which she tried steroids without improvement. She presented to the ED 9/29 due to worsening symptoms, also complained of some left tongue numbness. In the ED she was afebrile with neutrophilic leukocytosis (WBC 20k). LP was performed and ruled out meningitis. Head CT, then subsequent MRI of the brain, showed no intracranial abnormalities. However, a peripherally enhancing lesion was noted concerning for deep neck infection on MRI. Subsequent CT neck soft tissue revealed 1.4 cm x 1.0 cm x 1.4 cm abscess vs suppurative lateral retropharyngeal node in the lateral left retropharyngeal space with surrounding soft tissue thickening and inflammatory change resulting in mild narrowing of the nasopharyngeal airway. The collection abuts the medial aspect of the internal carotid artery which remains patent. There was also a small retropharyngeal effusion further mildly narrowing the nasopharyngeal and oropharyngeal airway without evidence of peripheral enhancement or extension into the mediastinum.  IV clindamycin was given and the patient was admitted  overnight. During this period, she showed significant clinical improvement. ENT was consulted and cleared for discharge the following day with plans to follow up in 1-2 weeks after completing 10 days of clindamycin.  Discharge Diagnoses:  Active Problems:   ADHD (attention deficit hyperactivity disorder), inattentive type   Retropharyngeal abscess   Leukocytosis  Discharge Instructions Discharge Instructions     Discharge instructions   Complete by: As directed    You were treated for a retropharyngeal abscess with improvement and ENT, Dr. Suszanne Conners, has cleared you for discharge. You will need to continue taking clindamycin three times per day for 10 days and follow up in his office in 1-2 weeks. Seek medical attention right away if you develop fever, worsening pain, trouble breathing or swallowing or severe diarrhea.      Allergies as of 01/29/2021       Reactions   Norelgestromin-eth Estradiol Dermatitis        Medication List     STOP taking these medications    predniSONE 20 MG tablet Commonly known as: DELTASONE       TAKE these medications    aspirin-acetaminophen-caffeine 250-250-65 MG tablet Commonly known as: EXCEDRIN MIGRAINE Take 1 tablet by mouth every 6 (six) hours as needed for headache.   Aurovela 24 FE 1-20 MG-MCG(24) tablet Generic drug: Norethindrone Acetate-Ethinyl Estrad-FE Take 1 tablet by mouth daily.   clindamycin 300 MG capsule Commonly known as: CLEOCIN Take 1 capsule (300 mg total) by mouth 3 (three) times daily for 10 days.   ibuprofen 200 MG tablet Commonly known as: ADVIL Take 200 mg by mouth every 6 (six) hours as needed for fever, headache or mild pain.   methylphenidate 36 MG CR tablet Commonly known as: Concerta Take 2  tablets (72 mg total) by mouth every morning. What changed:  how much to take when to take this   multivitamin with minerals Tabs tablet Take 1 tablet by mouth daily.        Follow-up Information      Marcene Corning, MD Follow up.   Specialty: Pediatrics Contact information: [facility] [facility] [employer] [address] [place] [address] [phone]         Newman Pies, MD Follow up in 1 week(s).   Specialty: Otolaryngology Contact information: [address] [place] [address] [place] [address] [phone]                Allergies  Allergen Reactions   Norelgestromin-Eth Estradiol Dermatitis    Consultations: ENT, Dr. Suszanne Conners  Procedures/Studies: CT Head Wo Contrast  Result Date: 01/28/2021 CLINICAL DATA:  Left side headache EXAM: CT HEAD WITHOUT CONTRAST TECHNIQUE: Contiguous axial images were obtained from the base of the skull through the vertex without intravenous contrast. COMPARISON:  None. FINDINGS: Brain: No acute intracranial abnormality. Specifically, no hemorrhage, hydrocephalus, mass lesion, acute infarction, or significant intracranial injury. Vascular: No hyperdense vessel or unexpected calcification. Skull: No acute calvarial abnormality. Sinuses/Orbits: No acute findings Other: None IMPRESSION: Normal study. Electronically Signed   By: Charlett Nose M.D.   On: 01/28/2021 08:47   CT Soft Tissue Neck W Contrast  Result Date: 01/28/2021 CLINICAL DATA:  Left-sided neck pain, abscess EXAM: CT NECK WITH CONTRAST TECHNIQUE: Multidetector CT imaging of the neck was performed using the standard protocol following the bolus administration of intravenous contrast. CONTRAST:  81mL OMNIPAQUE IOHEXOL 350 MG/ML SOLN COMPARISON:  Same-day head CT and brain MRI FINDINGS: Pharynx and larynx: The nasal cavity is normal. There is slight asymmetric effacement of the left nasopharynx. There is a 1.4 cm x 1.0 cm by 1.4 cm peripherally enhancing collection in the lateral left retropharyngeal space consistent with an abscess. There is thickening of the adjacent soft tissues in the carotid and parapharyngeal spaces. The collection abuts the medial aspect of the internal carotid  artery which is patent. There is mild stranding in the adjacent parapharyngeal fat. Mass effect from the collection and swelling effaces the internal jugular vein. The jugular bulb and internal jugular vein below the level of the collection is patent. A small locule of air within the left internal jugular vein is most likely due to intravenous access. There is a small retropharyngeal effusion measuring up to 5 mm in thickness resulting in further mild effacement of the nasopharyngeal airway and mild effacement of the oropharyngeal airway. There is no peripheral enhancement to suggest additional retropharyngeal abscess. This collection extends to the C5-C6 level without evidence of extension into the mediastinum. The oral cavity and oropharynx are otherwise normal. The hypopharynx and larynx are normal. Salivary glands: The parotid and submandibular glands are unremarkable. Thyroid: Unremarkable. Lymph nodes: There are prominent left-sided cervical chain lymph nodes measuring up to 1.0 cm in level II, likely reactive. Vascular: The left-sided vasculature as described above. The right vasculature is unremarkable. Limited intracranial: The intracranial compartment as assessed on the separately dictated brain MRI. Visualized orbits: The imaged globes and orbits are unremarkable. Mastoids and visualized paranasal sinuses: There is mild mucosal thickening in the left maxillary sinus. The imaged mastoid air cells are clear. Skeleton: The bones are unremarkable. Upper chest: The lung apices are clear. Other: None. IMPRESSION: 1. 1.4 cm x 1.0 cm x 1.4 cm abscess vs suppurative lateral retropharyngeal node in the lateral  left retropharyngeal space with surrounding soft tissue thickening and inflammatory change resulting in mild narrowing of the nasopharyngeal airway. The collection abuts the medial aspect of the internal carotid artery which remains patent. 2. Small retropharyngeal effusion further mildly narrowing the  nasopharyngeal and oropharyngeal airway without evidence of peripheral enhancement to suggest retropharyngeal abscess or extension into the mediastinum. Electronically Signed   By: Lesia Hausen M.D.   On: 01/28/2021 16:45   MR Brain W and Wo Contrast  Result Date: 01/28/2021 CLINICAL DATA:  Neuro deficit, acute, stroke suspected EXAM: MRI HEAD WITHOUT AND WITH CONTRAST TECHNIQUE: Multiplanar, multiecho pulse sequences of the brain and surrounding structures were obtained without and with intravenous contrast. CONTRAST:  66mL GADAVIST GADOBUTROL 1 MMOL/ML IV SOLN COMPARISON:  None. FINDINGS: Brain: No acute infarction, hemorrhage, hydrocephalus, extra-axial collection or mass lesion. No abnormal intracranial enhancement. Vascular: Major arterial flow voids are maintained at the skull base. Skull and upper cervical spine: Normal marrow signal. Sinuses/Orbits: Moderate left maxillary sinus mucosal thickening. Otherwise, mild paranasal sinus mucosal thickening. Unremarkable orbits. Other: Abnormal edema, enhancement, and soft tissue thickening involving the left skull base and visualized prevertebral and paraspinal soft tissues. This involves the left prevertebral musculature, left prevertebral parapharyngeal and carotid spaces. The left tonsil and adenoid is displaced anteriorly with some narrowing of the airway. On sagittal imaging there is evidence of prevertebral soft tissue thickening extending inferiorly into the upper neck, incompletely imaged. Discrete 1 x 0.7 x 1.2 Cm peripherally enhancing fluid collection in the left prevertebral prevertebral space, immediately adjacent to the left carotid space (series 9, image 30 series 7, image 16). No sizable mastoid effusions. The left internal jugular vein is completely effaced in this region. The jugular bulb and intracranial dural venous sinuses are patent. IMPRESSION: 1. Abnormal edema, enhancement, and soft tissue thickening involving the left skull base and  upper neck, detailed above and concerning for a severe deep neck infection. Discrete 1.2 cm peripherally enhancing fluid collection in the left prevertebral space is concerning for an abscess. Given findings are incompletely imaged, recommend CT neck with contrast. 2. No evidence of acute intracranial abnormality.  No acute infarct. Electronically Signed   By: Feliberto Harts M.D.   On: 01/28/2021 15:16   DG Chest Port 1 View  Result Date: 01/28/2021 CLINICAL DATA:  Fever, cough EXAM: PORTABLE CHEST 1 VIEW COMPARISON:  None. FINDINGS: The cardiomediastinal silhouette is normal. The lungs are clear, without focal consolidation or pulmonary edema. There is no pleural effusion or pneumothorax. There is no acute osseous abnormality. IMPRESSION: No radiographic evidence of acute cardiopulmonary process. Electronically Signed   By: Lesia Hausen M.D.   On: 01/28/2021 16:24     Subjective: No stridor, wheezing, dyspnea, fever. Generally already feels better regarding decreased pain on the left neck/head. No abd pain, N/V/D.  Discharge Exam: Vitals:   01/29/21 0900 01/29/21 1312  BP: 117/71 115/69  Pulse: 83 84  Resp: 15 17  Temp: 98.1 F (36.7 C) 98.5 F (36.9 C)  SpO2: 97% 98%   Gen: Nontoxic HEENT: Palatine tonsils absent. No visible airway intrusions.  Pulm: Clear and nonlabored on room air. No stridor. CV: RRR, no murmur, no JVD, no edema GI: Soft, NT, ND, +BS  Neuro: Alert and oriented. No focal deficits. Ext: Warm, no deformities Skin: No rashes, lesions or ulcers  Lab Basic Metabolic Panel: Recent Labs  Lab 01/28/21 0745 01/29/21 0558  NA 137 136  K 3.5 4.0  CL 108 104  CO2 22  23  GLUCOSE 113* 122*  BUN 7 11  CREATININE 0.66 0.64  CALCIUM 9.3 8.8*  MG  --  1.9  PHOS  --  3.9   Liver Function Tests: Recent Labs  Lab 01/28/21 0745 01/29/21 0558  AST 17 16  ALT 16 17  ALKPHOS 42 38  BILITOT 0.6 0.5  PROT 8.3* 7.0  ALBUMIN 3.7 3.2*   CBC: Recent Labs  Lab  01/28/21 0745 01/29/21 0558  WBC 20.8* 13.3*  NEUTROABS 17.5* 11.3*  HGB 13.0 11.2*  HCT 39.0 33.7*  MCV 86.3 85.5  PLT 223 219   Urinalysis    Component Value Date/Time   COLORURINE YELLOW 01/28/2021 0957   APPEARANCEUR CLEAR 01/28/2021 0957   LABSPEC 1.005 01/28/2021 0957   PHURINE 7.0 01/28/2021 0957   GLUCOSEU NEGATIVE 01/28/2021 0957   HGBUR NEGATIVE 01/28/2021 0957   BILIRUBINUR NEGATIVE 01/28/2021 0957   KETONESUR NEGATIVE 01/28/2021 0957   PROTEINUR NEGATIVE 01/28/2021 0957   NITRITE NEGATIVE 01/28/2021 0957   LEUKOCYTESUR MODERATE (A) 01/28/2021 0957    Microbiology Recent Results (from the past 240 hour(s))  Resp Panel by RT-PCR (Flu A&B, Covid) Nasopharyngeal Swab     Status: None   Collection Time: 01/28/21  7:45 AM   Specimen: Nasopharyngeal Swab; Nasopharyngeal(NP) swabs in vial transport medium  Result Value Ref Range Status   SARS Coronavirus 2 by RT PCR NEGATIVE NEGATIVE Final    Comment: (NOTE) SARS-CoV-2 target nucleic acids are NOT DETECTED.  The SARS-CoV-2 RNA is generally detectable in upper respiratory specimens during the acute phase of infection. The lowest concentration of SARS-CoV-2 viral copies this assay can detect is 138 copies/mL. A negative result does not preclude SARS-Cov-2 infection and should not be used as the sole basis for treatment or other patient management decisions. A negative result may occur with  improper specimen collection/handling, submission of specimen other than nasopharyngeal swab, presence of viral mutation(s) within the areas targeted by this assay, and inadequate number of viral copies(<138 copies/mL). A negative result must be combined with clinical observations, patient history, and epidemiological information. The expected result is Negative.  Fact Sheet for Patients:  BloggerCourse.com  Fact Sheet for Healthcare Providers:  SeriousBroker.it  This test is  no t yet approved or cleared by the [place] FDA and  has been authorized for detection and/or diagnosis of SARS-CoV-2 by FDA under an Emergency Use Authorization (EUA). This EUA will remain  in effect (meaning this test can be used) for the duration of the COVID-19 declaration under Section 564(b)(1) of the Act, 21 U.S.C.section 360bbb-3(b)(1), unless the authorization is terminated  or revoked sooner.       Influenza A by PCR NEGATIVE NEGATIVE Final   Influenza B by PCR NEGATIVE NEGATIVE Final    Comment: (NOTE) The Xpert Xpress SARS-CoV-2/FLU/RSV plus assay is intended as an aid in the diagnosis of influenza from Nasopharyngeal swab specimens and should not be used as a sole basis for treatment. Nasal washings and aspirates are unacceptable for Xpert Xpress SARS-CoV-2/FLU/RSV testing.  Fact Sheet for Patients: BloggerCourse.com  Fact Sheet for Healthcare Providers: SeriousBroker.it  This test is not yet approved or cleared by the [place] FDA and has been authorized for detection and/or diagnosis of SARS-CoV-2 by FDA under an Emergency Use Authorization (EUA). This EUA will remain in effect (meaning this test can be used) for the duration of the COVID-19 declaration under Section 564(b)(1) of the Act, 21 U.S.C. section 360bbb-3(b)(1), unless the authorization is terminated or revoked.  Performed at [facility], [address], [place], [place] [address]   Respiratory (~20 pathogens) panel by PCR     Status: None   Collection Time: 01/28/21  7:45 AM   Specimen: Nasopharyngeal Swab; Respiratory  Result Value Ref Range Status   Adenovirus NOT DETECTED NOT DETECTED Final   Coronavirus 229E NOT DETECTED NOT DETECTED Final    Comment: (NOTE) The Coronavirus on the Respiratory Panel, DOES NOT test for the novel  Coronavirus (2019 nCoV)    Coronavirus HKU1 NOT DETECTED NOT DETECTED Final    Coronavirus NL63 NOT DETECTED NOT DETECTED Final   Coronavirus OC43 NOT DETECTED NOT DETECTED Final   Metapneumovirus NOT DETECTED NOT DETECTED Final   Rhinovirus / Enterovirus NOT DETECTED NOT DETECTED Final   Influenza A NOT DETECTED NOT DETECTED Final   Influenza B NOT DETECTED NOT DETECTED Final   Parainfluenza Virus 1 NOT DETECTED NOT DETECTED Final   Parainfluenza Virus 2 NOT DETECTED NOT DETECTED Final   Parainfluenza Virus 3 NOT DETECTED NOT DETECTED Final   Parainfluenza Virus 4 NOT DETECTED NOT DETECTED Final   Respiratory Syncytial Virus NOT DETECTED NOT DETECTED Final   Bordetella pertussis NOT DETECTED NOT DETECTED Final   Bordetella Parapertussis NOT DETECTED NOT DETECTED Final   Chlamydophila pneumoniae NOT DETECTED NOT DETECTED Final   Mycoplasma pneumoniae NOT DETECTED NOT DETECTED Final    Comment: Performed at [facility], [address], [place], [place] [address]  Culture, blood (routine x 2)     Status: None (Preliminary result)   Collection Time: 01/28/21  7:48 AM   Specimen: BLOOD  Result Value Ref Range Status   Specimen Description   Final    BLOOD LEFT ANTECUBITAL Performed at [facility], [address], [place], [place] [address]    Special Requests   Final    BOTTLES DRAWN AEROBIC AND ANAEROBIC Blood Culture adequate volume Performed at [facility], [address], [place], [place] [address]    Culture   Final    NO GROWTH 2 DAYS Performed at [facility], [address], [place], [place] [address]    Report Status PENDING  Incomplete  Urine Culture     Status: Abnormal   Collection Time: 01/28/21  9:55 AM   Specimen: Urine, Clean Catch  Result Value Ref Range Status   Specimen Description   Final    URINE, CLEAN CATCH Performed at [facility], [address], [place], [place] [address]    Special Requests   Final    NONE Performed at [facility] [place], [address] [place], [place] [address]    Culture (A)  Final    <10,000 COLONIES/mL INSIGNIFICANT GROWTH Performed at [facility], [address], [place], [place] [address]    Report Status 01/29/2021 FINAL  Final  CSF culture     Status: None (Preliminary result)   Collection Time: 01/28/21 10:23 AM   Specimen: Back; Cerebrospinal Fluid  Result Value Ref Range Status   Specimen Description   Final    BACK CSF Performed at [facility], [address] [place], [place] [address]    Special Requests   Final    NONE Performed at [facility], [address], [place], [place] [address]    Gram Stain   Final    NO WBC SEEN NO ORGANISMS SEEN Gram Stain Report Called to,Read Back By  and Verified With: MCIVER,M. RN @1216  01/28/2021 NMCKOY Performed at [facility], [address], [place], [place]    Culture   Final    NO GROWTH 2 DAYS Performed at [facility], [address] [place], [place]    Report Status PENDING  Incomplete  Culture, blood (routine x 2)     Status: None (Preliminary result)   Collection Time: 01/28/21  8:58 PM   Specimen: BLOOD  Result Value Ref Range Status   Specimen Description   Final    BLOOD BLOOD RIGHT ARM Performed at [facility], [address], [place], [place]    Special Requests   Final    BOTTLES DRAWN AEROBIC ONLY Blood Culture adequate volume Performed at [facility], [address] [place], [place]    Culture   Final    NO GROWTH 2 DAYS Performed at [facility], [address], [place], [place]    Report Status PENDING  Incomplete    Time coordinating discharge: Approximately 40 minutes  [id number], MD  [facility] 01/30/2021, 6:13 PM

## 2021-01-31 LAB — CSF CULTURE W GRAM STAIN
Culture: NO GROWTH
Gram Stain: NONE SEEN

## 2021-02-02 LAB — CULTURE, BLOOD (ROUTINE X 2)
Culture: NO GROWTH
Culture: NO GROWTH
Special Requests: ADEQUATE
Special Requests: ADEQUATE

## 2021-02-22 ENCOUNTER — Telehealth: Payer: Medicaid Other | Admitting: Family

## 2021-02-23 ENCOUNTER — Other Ambulatory Visit: Payer: Self-pay

## 2021-02-23 DIAGNOSIS — F900 Attention-deficit hyperactivity disorder, predominantly inattentive type: Secondary | ICD-10-CM

## 2021-02-23 MED ORDER — METHYLPHENIDATE HCL ER (OSM) 36 MG PO TBCR: 72.0000 mg | EXTENDED_RELEASE_TABLET | ORAL | 0 refills | Status: DC

## 2021-02-23 NOTE — Telephone Encounter (Signed)
Concerta 36 mg 1-2 tablets daily, # 60 with no RF's.RX for above e-scribed and sent to pharmacy on record  [facility] [phone] [place], [place] [address] [facility] [address] [place] [address] Phone: [phone] Fax: [phone]

## 2021-04-27 ENCOUNTER — Ambulatory Visit (INDEPENDENT_AMBULATORY_CARE_PROVIDER_SITE_OTHER): Payer: Medicaid Other | Admitting: Family

## 2021-04-27 ENCOUNTER — Other Ambulatory Visit: Payer: Self-pay

## 2021-04-27 ENCOUNTER — Encounter: Payer: Self-pay | Admitting: Family

## 2021-04-27 VITALS — BP 108/66 | HR 76 | Resp 16 | Ht 62.6 in | Wt 116.6 lb

## 2021-04-27 DIAGNOSIS — Z79899 Other long term (current) drug therapy: Secondary | ICD-10-CM

## 2021-04-27 DIAGNOSIS — F900 Attention-deficit hyperactivity disorder, predominantly inattentive type: Secondary | ICD-10-CM

## 2021-04-27 DIAGNOSIS — Z719 Counseling, unspecified: Secondary | ICD-10-CM | POA: Diagnosis not present

## 2021-04-27 DIAGNOSIS — F411 Generalized anxiety disorder: Secondary | ICD-10-CM | POA: Diagnosis not present

## 2021-04-27 DIAGNOSIS — Z7189 Other specified counseling: Secondary | ICD-10-CM

## 2021-04-27 MED ORDER — METHYLPHENIDATE HCL ER (OSM) 36 MG PO TBCR: 72.0000 mg | EXTENDED_RELEASE_TABLET | ORAL | 0 refills | Status: DC

## 2021-04-27 NOTE — Progress Notes (Signed)
[facility] [place] [place] [facility] [address] [place] [address] Dept: [phone] Dept Fax: [phone] Loc: [phone] Loc Fax: [phone]  Medication Check  Patient ID: [name], female  DOB: Feb 24, 2001, 20 y.o.  MRN: [id number]  Date of Evaluation: 04/27/2021 PCP: Marcene Corning, MD  Accompanied by:  self Patient Lives with:   HISTORY/CURRENT STATUS: HPI Patient here by herself for the visit today. Patient interactive and appropriate with provider today. Doing well at school this past semester with her 1st semester at [employer]. Participating in a sorority at school with some activities. Has continued with Concerta 36 mg daily 1-2 tablets with using PRN Ritalin 5 mg with no side effects.   EDUCATION: School: [employer]  Year/Grade:  college   Homework Hours Spent: Depending on classes Performance/ Grades: above average Services: Other: None Activities/ Exercise: intermittently  MEDICAL HISTORY: Appetite: Good  MVI/Other: None Sleep: No current issues reported Concerns: Initiation/Maintenance/Other: None   Individual Medical History/ Review of Systems: Changes? :Yes URI, retropharyngeal abscess. Abx with overnight stay. Discharged on Abx and Steroids.   Allergies: Norelgestromin-eth estradiol  Current Medications: Current Outpatient Medications  Medication Instructions   adapalene (DIFFERIN) 0.1 % cream Topical, Daily   aspirin-acetaminophen-caffeine (EXCEDRIN MIGRAINE) 250-250-65 MG tablet 1 tablet, Every 6 hours PRN   AUROVELA 24 FE 1-20 MG-MCG(24) tablet 1 tablet, Oral, Daily   ibuprofen (ADVIL) 200 mg, Every 6 hours PRN   methylphenidate (CONCERTA) 72 mg, Oral, BH-each morning   Multiple Vitamin (MULTIVITAMIN WITH MINERALS) TABS tablet 1 tablet, Daily   Medication Side Effects: None  Family Medical/ Social History: Changes?  None  MENTAL HEALTH: Mental Health Issues: Anxiety-less now with no current issues.   PHYSICAL EXAM; Vitals:  Vitals:   04/27/21 0913  BP: 108/66  Pulse: 76  Resp: 16  Weight: 116 lb 9.6 oz (52.9 kg)  Height: 5' 2.6" (1.59 m)    General Physical Exam: Unchanged from previous exam, date:01/18/2021 Changed:None  DIAGNOSES:    ICD-10-CM   1. ADHD (attention deficit hyperactivity disorder), inattentive type  F90.0 methylphenidate (CONCERTA) 36 MG PO CR tablet    2. Generalized anxiety disorder  F41.1     3. Medication management  Z79.899     4. Patient counseled  Z71.9     5. Goals of care, counseling/discussion  Z71.89     ASSESSMENT: [name] is an 20 year old female with a history ADHD and Anxiety. She has been well controlled on Concerta 36 mg 2 daily 1-2 tablets daily. Not currently using her Ritalin 5 mg PRN in the evening. Did well at [employer] this past semester with no issues reported. 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 Joined a sorority and participating in some activity. Some activity when able to participate. NO changes with eating or sleeping. Recently was in the ED due to retropharyngeal abscess with overnight stay for Abx treatment via IV and was sent home with medications. Recent rash that comes and goes, possibly due to the viral infections, but no treatment. Medication will remain the same with no changes today.   RECOMMENDATIONS:  Updates provided by patient for school, academics, progress and classes for next semester.   Discussed services available on campus but not needing any formal services in place. Extra help or tutoring can be sought out if needed.  Activities and sorority life discussed with social/peer interactions on campus.  Discussed recent health issues and hospital stay over night. Still having some residual effects from the viral infection with a rash. Suggested OTC treatment or f/u with PCP if persists.    Getting some exercise and working while at home. Supported continuation of activity for healthy lifestyle.    Sleep schedule and sleep hygiene discussed with patient. Support given for continuation of appropriate sleep each night.   Counseled medication pharmacokinetics, options, dosage, administration, desired effects, and possible side effects.   Concerta 36 mg 1-2 daily, # 60 with no RF today Ritaln 5 mg in the pm PRN  I discussed the assessment and treatment plan with the patient. The patient was provided an opportunity to ask questions and all were answered. The patient agreed with the plan and demonstrated an understanding of the instructions.  NEXT APPOINTMENT: Return in about 3 months (around 07/26/2021) for f/u visit .  The patient was advised to call back or seek an in-person evaluation if the symptoms worsen or if the condition fails to improve as anticipated.  Carron Curie, NP

## 2021-06-28 ENCOUNTER — Other Ambulatory Visit: Payer: Self-pay

## 2021-06-28 DIAGNOSIS — F900 Attention-deficit hyperactivity disorder, predominantly inattentive type: Secondary | ICD-10-CM

## 2021-06-28 MED ORDER — METHYLPHENIDATE HCL ER (OSM) 36 MG PO TBCR: 72.0000 mg | EXTENDED_RELEASE_TABLET | ORAL | 0 refills | Status: DC

## 2021-06-28 NOTE — Telephone Encounter (Signed)
Concerta 36 mg daily, # 30 with no RF's.RX for above e-scribed and sent to pharmacy on record  [id number] - [place], [place] - [address] [facility] [address] [place] [address] Phone: [phone] Fax: [phone]    

## 2021-07-12 ENCOUNTER — Other Ambulatory Visit: Payer: Self-pay

## 2021-07-12 ENCOUNTER — Encounter: Payer: Self-pay | Admitting: Family

## 2021-07-12 ENCOUNTER — Telehealth (INDEPENDENT_AMBULATORY_CARE_PROVIDER_SITE_OTHER): Payer: Medicaid Other | Admitting: Family

## 2021-07-12 DIAGNOSIS — F900 Attention-deficit hyperactivity disorder, predominantly inattentive type: Secondary | ICD-10-CM

## 2021-07-12 DIAGNOSIS — Z719 Counseling, unspecified: Secondary | ICD-10-CM

## 2021-07-12 DIAGNOSIS — Z79899 Other long term (current) drug therapy: Secondary | ICD-10-CM | POA: Diagnosis not present

## 2021-07-12 DIAGNOSIS — F411 Generalized anxiety disorder: Secondary | ICD-10-CM | POA: Diagnosis not present

## 2021-07-12 DIAGNOSIS — Z7189 Other specified counseling: Secondary | ICD-10-CM

## 2021-07-12 NOTE — Progress Notes (Signed)
? DEVELOPMENTAL AND PSYCHOLOGICAL CENTER ?[facility] ?[address] ?[place] [address] ?Dept: [phone] ?Dept Fax: [phone] ? ?Medication Check visit via Virtual Video  ? ?Patient ID:  [name]  female DOB: Jul 16, 2000   21 y.o.   MRN: [id number]  ? ?DATE:07/12/21 ? ?PCP: [name] Corning, MD ? ?Virtual Visit via Video Note ? ?I connected with  [name] on 07/12/21 at  1:00 PM EDT by a video enabled telemedicine application and verified that I am speaking with the correct person using two identifiers. Patient/Parent Location: at school ?  ?I discussed the limitations, risks, security and privacy concerns of performing an evaluation and management service by telephone and the availability of in person appointments. I also discussed with the parents that there may be a patient responsible charge related to this service. The parents expressed understanding and agreed to proceed. ? ?Provider: Carron Curie, NP  Location: private work location ? ?HPI/CURRENT STATUS: ?[name] is here for medication management of the psychoactive medications for ADHD and review of educational and behavioral concerns.  ? ?[name] currently taking Concerta 36 mg daily 1-2 daily, which is working well. Takes medication at 7-9:00 am. Medication tends to wear off around afternoon with rare use of Ritalin. [name] is able to focus through school & homework.  ? ?[name] is eating well (eating breakfast, lunch and dinner). [name] does not have appetite suppression ? ?Sleeping well (goes to bed at 11-12:00 am wakes at 7-9:00 am), sleeping through the night. [name] does not have delayed sleep onset, but waking occasionally to use the bathroom.  ? ?EDUCATION: ?School: [employer] ?Year/Grade:  college student   ?Performance/ Grades: above average ?Services: Other: None reported  ?[name] for 2 summer classes ?Work at [employer] for the summer ? ?Activities/ Exercise:  intermittently-running more  ? ?MEDICAL HISTORY: ?Individual Medical History/ Review of Systems: None reported  Has been healthy with no visits to the PCP. WCC due yearly and for GYN.  ? ?Family Medical/ Social History: Changes? None ?Patient Lives with:  roommate at school ? ?MENTAL HEALTH: ?Mental Health Issues:   Anxiety-more with messy roommate  ? ?Allergies: ?Allergies  ?Allergen Reactions  ? Norelgestromin-Eth Estradiol Dermatitis  ? ?Current Medications:  ?Current Outpatient Medications  ?Medication Instructions  ? adapalene (DIFFERIN) 0.1 % cream Topical, Daily  ? aspirin-acetaminophen-caffeine (EXCEDRIN MIGRAINE) 250-250-65 MG tablet 1 tablet, Every 6 hours PRN  ? AUROVELA 24 FE 1-20 MG-MCG(24) tablet 1 tablet, Oral, Daily  ? ibuprofen (ADVIL) 200 mg, Every 6 hours PRN  ? methylphenidate (CONCERTA) 72 mg, Oral, BH-each morning  ? Multiple Vitamin (MULTIVITAMIN WITH MINERALS) TABS tablet 1 tablet, Daily  ? ?Medication Side Effects: None ? ?DIAGNOSES:  ?  ICD-10-CM   ?1. ADHD (attention deficit hyperactivity disorder), inattentive type  F90.0   ?  ?2. Generalized anxiety disorder  F41.1   ?  ?3. Medication management  Z79.899   ?  ?4. Patient counseled  Z71.9   ?  ?5. Goals of care, counseling/discussion  Z71.89   ?  ? 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 She has a history of ADHD and Anxiety with good symptom control on her Concerta 36 mg 1-2 daily and on a rare occasion her Ritalin 5 mg daily. Doing well academically with harder classes this semester and taking 2 classes this summer. No formal services in place at  school. Has stayed active with participating in various events with the sorority. No recent changes with eating, sleeping or health in the past 3 months. [name] has done well with Concerta with no changes needed and occasional use of Ritalin. Reassess medications in the next 3 months.  ? ?PLAN/RECOMMENDATIONS:  ?Patient provided updates for  school, academics, progress and classes this semester.  ? ?No formal services at school and tutoring/help available if needed.  ? ?Discussed summer classes at [employer] online this summer. ? ?Getting some activity with participating with her sorority and running on occasion. ? ?Eating well with no concern and suggested healthy variety of foods daily.  ? ?Sleep hygiene and sleep schedule reviewed for optimal sleep each night.  ? ?Discussed recent anxiety increase due to roommate not cleaning up after herself. Encouraged to reach out to campus counseling services if anxiety continues.  ? ?Counseled medication pharmacokinetics, options, dosage, administration, desired effects, and possible side effects.   ?Concerta 36 mg daily, no Rx today ?Ritalin 5 mg PRN in the afternoon, no Rx today ?  ?I discussed the assessment and treatment plan with the patient. The patient was provided an opportunity to ask questions and all were answered. The patient agreed with the plan and demonstrated an understanding of the instructions. ?  ?NEXT APPOINTMENT:  ?10/05/2021-f/u visit  ?Telehealth OK ? ?The patient was advised to call back or seek an in-person evaluation if the symptoms worsen or if the condition fails to improve as anticipated. ? ?[name] Curie, NP ? ?

## 2021-08-05 ENCOUNTER — Other Ambulatory Visit: Payer: Self-pay

## 2021-08-05 DIAGNOSIS — F900 Attention-deficit hyperactivity disorder, predominantly inattentive type: Secondary | ICD-10-CM

## 2021-08-05 MED ORDER — METHYLPHENIDATE HCL ER (OSM) 36 MG PO TBCR: 72.0000 mg | EXTENDED_RELEASE_TABLET | ORAL | 0 refills | Status: DC

## 2021-08-05 NOTE — Telephone Encounter (Signed)
Concerta 36 mg 1-2 daily, # 60 with no RF's.RX for above e-scribed and sent to pharmacy on record  [facility] - [place], [place] - [address] [facility] [place] [address] Phone: [phone] Fax: [phone]   

## 2021-09-06 ENCOUNTER — Other Ambulatory Visit: Payer: Self-pay

## 2021-09-06 DIAGNOSIS — F900 Attention-deficit hyperactivity disorder, predominantly inattentive type: Secondary | ICD-10-CM

## 2021-09-07 MED ORDER — METHYLPHENIDATE HCL ER (OSM) 36 MG PO TBCR: 72.0000 mg | EXTENDED_RELEASE_TABLET | ORAL | 0 refills | Status: DC

## 2021-09-07 NOTE — Addendum Note (Signed)
Addended by: Trish Fountain on: 09/07/2021 12:43 PM ? ? Modules accepted: Orders ? ?

## 2021-09-07 NOTE — Telephone Encounter (Signed)
Resent Rx due to [facility] lack of acceptance of the e-script. ?

## 2021-09-07 NOTE — Addendum Note (Signed)
Addended by: Carolann Littler on: 09/07/2021 08:43 AM ? ? Modules accepted: Orders ? ?

## 2021-09-07 NOTE — Telephone Encounter (Signed)
Concerta 36 mg 2 daily ,# 60 with no RF's.RX for above e-scribed and sent to pharmacy on record ? ?[id number] - [place], [place] - [address] [facility] [address] ?[address] ?[place] [address] ?Phone: [phone] Fax: [phone] ? ? ? ?

## 2021-09-07 NOTE — Telephone Encounter (Signed)
RX for above e-scribed and sent to pharmacy on record ? ?Resent due to the previously prescribed failed to send to pharmacy ? ?[id number] - [place], [place] - [address] [facility] [address] ?[address] ?[place] [address] ?Phone: [phone] Fax: [phone] ? ? ? ?

## 2021-09-17 ENCOUNTER — Other Ambulatory Visit: Payer: Self-pay

## 2021-09-17 MED ORDER — AZSTARYS 26.1-5.2 MG PO CAPS: 26.1000 mg | ORAL_CAPSULE | Freq: Every day | ORAL | 0 refills | Status: DC

## 2021-09-17 NOTE — Telephone Encounter (Signed)
[facility] does not [facility]a in stock due to Sport and exercise psychologist. DPL would like to change patient to Azstarys 26.1mg  and will follow up with patient at 6/6 appointment

## 2021-09-17 NOTE — Telephone Encounter (Signed)
Discontinued Concerta due to national shortage and try Azstarys 26.1-5.2 mg daily, # 30 with no RF's.RX for above e-scribed and sent to pharmacy on record  [facility] [place], [place] - [address] [place] [address] Phone: [phone] Fax: [phone]

## 2021-09-22 ENCOUNTER — Telehealth: Payer: Self-pay

## 2021-09-22 NOTE — Telephone Encounter (Signed)
Outcome Deniedtoday Request Reference Number: [id number]. AZSTARYS CAP 26.1-5.2 is denied for not meeting the prior authorization requirement(s). Details of this decision are in the notice attached below or have been faxed to you.

## 2021-10-05 ENCOUNTER — Encounter: Payer: Self-pay | Admitting: Family

## 2021-10-05 ENCOUNTER — Ambulatory Visit (INDEPENDENT_AMBULATORY_CARE_PROVIDER_SITE_OTHER): Payer: Medicaid Other | Admitting: Family

## 2021-10-05 VITALS — BP 112/64 | HR 68 | Resp 16 | Ht 62.0 in | Wt 121.8 lb

## 2021-10-05 DIAGNOSIS — Z79899 Other long term (current) drug therapy: Secondary | ICD-10-CM

## 2021-10-05 DIAGNOSIS — Z7189 Other specified counseling: Secondary | ICD-10-CM

## 2021-10-05 DIAGNOSIS — F411 Generalized anxiety disorder: Secondary | ICD-10-CM | POA: Diagnosis not present

## 2021-10-05 DIAGNOSIS — Z719 Counseling, unspecified: Secondary | ICD-10-CM

## 2021-10-05 DIAGNOSIS — F900 Attention-deficit hyperactivity disorder, predominantly inattentive type: Secondary | ICD-10-CM | POA: Diagnosis not present

## 2021-10-05 MED ORDER — AZITHROMYCIN 250 MG PO TABS: ORAL_TABLET | ORAL | 0 refills | Status: DC

## 2021-10-05 MED ORDER — METHYLPHENIDATE HCL 5 MG PO TABS: 5.0000 mg | ORAL_TABLET | Freq: Every evening | ORAL | 0 refills | Status: DC

## 2021-10-05 NOTE — Progress Notes (Signed)
[place] [facility] [place] [facility] [address] [place] [address] Dept: [phone] Dept Fax: [phone] Loc: [phone] Loc Fax: [phone]  Medication Check  Patient ID: [name], female  DOB: 2001-04-24, 21 y.o.  MRN: [id number]  Date of Evaluation: 10/05/2021 PCP: Marcene Corning, MD  Accompanied by:  self Patient Lives with: parents  HISTORY/CURRENT STATUS: HPI Patient is here by herself for the visit today and interactive with provider. Taking summer classes online at [employer] and working at [employer]. No current issues reported, but has not started the new medication due to confusion of how and where to pick up the medication.   EDUCATION: School: [employer] to apply to [employer] in October  Online classes for the summer-3 classes for 1st session Year/Grade:  college    Homework Hours Spent: Depending on classes Performance/ Grades: above average Services: Other: disability services Activities/ Exercise:  working out 5 days/week and playing tennis  MEDICAL HISTORY: Appetite: Good  MVI/Other: None   Getting a variety of foods Sleep: Bedtime: 11:00 pm  Awakens: 7:30 am  Concerns: Initiation/Maintenance/Other: None  Individual Medical History/ Review of Systems: Changes? :Yes Urgent care recently due to bacterial sinusitis and not getting any better  Allergies: Norelgestromin-eth estradiol  Current Medications: Current Outpatient Medications  Medication Instructions   adapalene (DIFFERIN) 0.1 % cream Topical, Daily   aspirin-acetaminophen-caffeine (EXCEDRIN MIGRAINE) 250-250-65 MG tablet 1 tablet, Every 6 hours PRN   AUROVELA 24 FE 1-20 MG-MCG(24) tablet 1 tablet, Oral, Daily   azithromycin (ZITHROMAX Z-PAK) 250 MG tablet Take 2 tablets by mouth for the initial dose and 1 tablet daily after until gone.   ibuprofen (ADVIL) 200 mg, Every 6 hours PRN    methylphenidate (RITALIN) 5 mg, Oral, Every evening   Multiple Vitamin (MULTIVITAMIN WITH MINERALS) TABS tablet 1 tablet, Daily   Serdexmethylphen-Dexmethylphen (AZSTARYS) 26.1-5.2 MG CAPS 26.1 mg, Oral, Daily   Medication Side Effects: None Family Medical/ Social History: Changes? None  MENTAL HEALTH: Mental Health Issues: Anxiety-better   PHYSICAL EXAM; Vitals:  Vitals:   10/05/21 0913  BP: 112/64  Pulse: 68  Resp: 16  Weight: 121 lb 12.8 oz (55.2 kg)  Height: 5\' 2"  (1.575 m)    General Physical Exam: Unchanged from previous exam, date:07/12/2021 Changed:congestion and sinusitis  DIAGNOSES:    ICD-10-CM   1. ADHD (attention deficit hyperactivity disorder), inattentive type  F90.0     2. Generalized anxiety disorder  F41.1     3. Medication management  Z79.899     4. Patient counseled  Z71.9     5. Goals of care, counseling/discussion  Z71.89      ASSESSMENT: [employer] is a 21 year old female with a history of ADHD and Anxiety. She has been changed to Azstarys from Concerta due to the national shortage, but has not started the medication due to confusion of where to get the Rx. Academically has done well at school with taking clsses this summer and planning to apply to [employer] school in October. Has had some issues with sinusitis and bacterial infection that is not resolved over the past 3 weeks with ongoing symptoms. Change with insurance has caused changes with BC due to cost. No recent issues with eating, sleeping well and getting plenty of exercise. Medication management and start of new medication in the next few days.   RECOMMENDATIONS:  School, academics, progress and recent grades discussed with November.   Taking  summer classes and to apply to the nursing program in the fall.  No formal services in place at school, but can get extra help as needed.   Health updates with change in [name] recently due to cost and insurance.   Encouraged transition to family practice with  recommendations provided for [facility] in [place] for primary care.  Due to ongoing sinus drainage and current symptoms recommended saline nasal spray and Claritin D for 2 weeks to alleviate symptoms.   Discussed continued healthy eating habits and exercise on a regular basis to maintain her health.  Sleep schedule discussed with good sleep hygiene reviewed with patient.   Discussed starting new medication and provided information on pharmacy and contacted the pharmacy staff regarding coupon for medication discount program.  Counseled medication pharmacokinetics, options, dosage, administration, desired effects, and possible side effects.   Azstarys 26.1-5.2 mg daily to start the medication in the next few days. Ritalin 5 mg daily in the evening, # 30 with no RF's Azithromycin 250 for 6 tablets for dosing of 4 days.   I discussed the assessment and treatment plan with the patient. The patient was provided an opportunity to ask questions and all were answered. The patient agreed with the plan and demonstrated an understanding of the instructions.  NEXT APPOINTMENT: Return in about 3 months (around 01/05/2022) for f/u visit .  The patient was advised to call back or seek an in-person evaluation if the symptoms worsen or if the condition fails to improve as anticipated.  Carron Curie, NP

## 2021-10-28 ENCOUNTER — Other Ambulatory Visit: Payer: Self-pay

## 2021-10-28 MED ORDER — AZSTARYS 26.1-5.2 MG PO CAPS: 26.1000 mg | ORAL_CAPSULE | Freq: Every day | ORAL | 0 refills | Status: DC

## 2021-10-28 NOTE — Telephone Encounter (Signed)
Azstarys 26.1-5.2 mg daily, # 30 with no RF's.RX for above e-scribed and sent to pharmacy on record  [facility] [place], [place] - [address] [place] [address] Phone: [phone] Fax: [phone]

## 2021-11-29 ENCOUNTER — Telehealth (INDEPENDENT_AMBULATORY_CARE_PROVIDER_SITE_OTHER): Payer: Medicaid Other | Admitting: Family

## 2021-11-29 ENCOUNTER — Encounter: Payer: Self-pay | Admitting: Family

## 2021-11-29 DIAGNOSIS — Z719 Counseling, unspecified: Secondary | ICD-10-CM

## 2021-11-29 DIAGNOSIS — Z79899 Other long term (current) drug therapy: Secondary | ICD-10-CM

## 2021-11-29 DIAGNOSIS — F900 Attention-deficit hyperactivity disorder, predominantly inattentive type: Secondary | ICD-10-CM

## 2021-11-29 DIAGNOSIS — F411 Generalized anxiety disorder: Secondary | ICD-10-CM | POA: Diagnosis not present

## 2021-11-29 DIAGNOSIS — Z7189 Other specified counseling: Secondary | ICD-10-CM

## 2021-11-29 MED ORDER — AZSTARYS 26.1-5.2 MG PO CAPS: 26.1000 mg | ORAL_CAPSULE | Freq: Every day | ORAL | 0 refills | Status: DC

## 2021-11-29 NOTE — Progress Notes (Signed)
[name] [address] [place]. [address] [place] [address] Dept: [phone] Dept Fax: [phone]  Medication Check visit via Virtual Video   Patient ID:  [name]  female DOB: March 09, 2001   21 y.o.   MRN: [id number]   DATE:11/29/21  PCP: Marcene Corning, MD  Virtual Visit via Video Note  I connected with  [name] on 11/29/21 at  2:00 PM EDT by a video enabled telemedicine application and verified that I am speaking with the correct person using two identifiers. Patient/Parent Location: at her apartment  I discussed the limitations, risks, security and privacy concerns of performing an evaluation and management service by telephone and the availability of in person appointments. I also discussed with the parents that there may be a patient responsible charge related to this service. The parents expressed understanding and agreed to proceed.  Provider: Carron Curie, NP  Location: private work location  HPI/CURRENT STATUS: [name] is here for medication management of the psychoactive medications for ADHD and review of educational and behavioral concerns.   Arloa currently taking Azstarys 26.1-5.2 mg daily, which is working well. Takes medication in the morning.  Medication tends to wear off around evening and will take Ritalin in the evening as needed. Keyaria is able to focus through schol & homework.   Jayona is eating well (eating breakfast, lunch and dinner). Jill does not have appetite suppression  Sleeping well (getting plenty of sleep each night), sleeping through the night. Xyla does not have delayed sleep onset   EDUCATION: School: [employer] Year/Grade:  college   Performance/ Grades: above average Services: Other: disability services  Activities/ Exercise: daily of some sort of activity  MEDICAL HISTORY: Individual Medical History/ Review of Systems: None reported recently  Has  been healthy with no visits to the PCP. WCC due yearly.   Family Medical/ Social History:  Patient Lives with:  4 roommates  MENTAL HEALTH: Mental Health Issues:   Anxiety better now  Allergies: Allergies  Allergen Reactions   Norelgestromin-Eth Estradiol Dermatitis   Current Medications:  Current Outpatient Medications  Medication Instructions   adapalene (DIFFERIN) 0.1 % cream Topical, Daily   aspirin-acetaminophen-caffeine (EXCEDRIN MIGRAINE) 250-250-65 MG tablet 1 tablet, Every 6 hours PRN   AUROVELA 24 FE 1-20 MG-MCG(24) tablet 1 tablet, Oral, Daily   azithromycin (ZITHROMAX Z-PAK) 250 MG tablet Take 2 tablets by mouth for the initial dose and 1 tablet daily after until gone.   ibuprofen (ADVIL) 200 mg, Every 6 hours PRN   methylphenidate (RITALIN) 5 mg, Oral, Every evening   Multiple Vitamin (MULTIVITAMIN WITH MINERALS) TABS tablet 1 tablet, Daily   Serdexmethylphen-Dexmethylphen (AZSTARYS) 26.1-5.2 MG CAPS 26.1 mg, Oral, Daily   Medication Side Effects: None  DIAGNOSES:    ICD-10-CM   1. ADHD (attention deficit hyperactivity disorder), inattentive type  F90.0     2. Generalized anxiety disorder  F41.1     3. Medication management  Z79.899     4. Patient counseled  Z71.9     5. Goals of care, counseling/discussion  Z71.89      ASSESSMENT:      [name] is a 21 year old female with a history of ADHD and Anxiety. She has been on Azstarys for the past 3 months with positive results and no side effects. Using her Ritalin 5 mg in the evening as needed with no issues or concerns. Academically doing well with completion of summer classes and  fall classes to start in 2 weeks. Disability services on campus has continued for learning support. To apply to nursing school in the fall. No changes with health in the past 3 months, Eating well and getting plenty of protein. Sleeping with no recent concerns or changes. Moved into her new apartment with several roommates and will  participate in [employer] for her sorority in the next 10 days. Continuation of medication as previously with no changes.   PLAN/RECOMMENDATIONS:  Patient provided updates for academics and summer class with enrollment in her fall classes.  Disability services to continue at the university with no changes reported for the semester.  Enrolled in several fall classes and will apply to nursing school in October for next year.   Medical changes and updates provided with no significant concerns in the past 3 months.   Healthy eating habits and plenty of protein discussed with continued daily exercise.  Sleep schedule has not been of concern, but not as sleepy on the night she is taking her medication.   Medication management and continued daily dosing encouraged with start of school.   Counseled medication pharmacokinetics, options, dosage, administration, desired effects, and possible side effects.   Azstarys 26.1-5.2 mg daily, # 30 with no RF's Ritalin 5 mg daily in the afternoon, no Rx today RX for above e-scribed and sent to pharmacy on record  [id number] [place] [place] - [address] [place] [address] Phone: [phone] Fax: [phone]  I discussed the assessment and treatment plan with the patient. The patient was provided an opportunity to ask questions and all were answered. The patient agreed with the plan and demonstrated an understanding of the instructions.   NEXT APPOINTMENT:  03/18/2022   f/u visit for 3 months Telehealth OK  The patient was advised to call back or seek an in-person evaluation if the symptoms worsen or if the condition fails to improve as anticipated.   Carron Curie, NP

## 2021-12-08 ENCOUNTER — Other Ambulatory Visit: Payer: Self-pay

## 2021-12-08 MED ORDER — AZSTARYS 26.1-5.2 MG PO CAPS: 26.1000 mg | ORAL_CAPSULE | Freq: Every day | ORAL | 0 refills | Status: DC

## 2021-12-08 NOTE — Telephone Encounter (Signed)
Azstarys 26.1-5.2 mg daily, # 30 with no RF's.RX for above e-scribed and sent to pharmacy on record  [id number] [place], [place] - [address] [place] [address] Phone: [phone] Fax: [phone]

## 2022-02-18 IMAGING — MR MR HEAD WO/W CM
15 series · 48 of 48 positions shown · IV contrast (gadavist)
Comparison: None.

CLINICAL DATA: Neuro deficit, acute, stroke suspected

EXAM:
MRI HEAD WITHOUT AND WITH CONTRAST
TECHNIQUE: Multiplanar, multiecho pulse sequences of the brain and surrounding
structures were obtained without and with intravenous contrast.
CONTRAST:  5mL GADAVIST GADOBUTROL 1 MMOL/ML IV SOLN

[Series 5: DWI · axial · 3.0mm · 1.36mm/px · z∈[-19,+120]mm · 7 of 96 slices shown (1 of 2)]
[im 1/96]
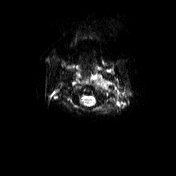
[im 16/96]
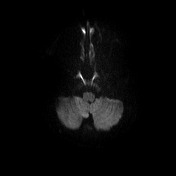
[im 32/96]
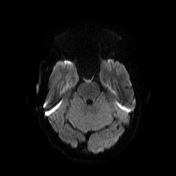
[im 48/96]
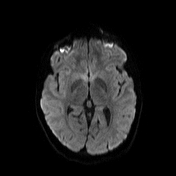
[im 64/96]
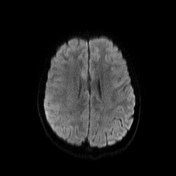
[im 80/96]
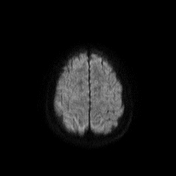
[im 96/96]
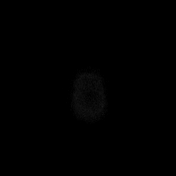

[Series 6: DWI · axial · 3.0mm · 1.36mm/px · z∈[-19,+120]mm · 4 of 48 slices shown (2 of 2)]
[im 1/48]
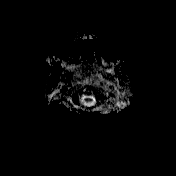
[im 16/48]
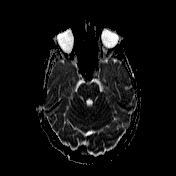
[im 32/48]
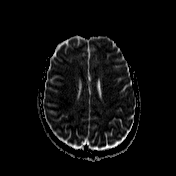
[im 48/48]
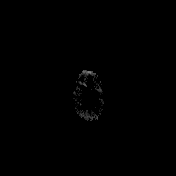

[Series 7: T1 · sagittal · 5.0mm · 0.75mm/px · 1 of 24 slices shown (1 of 4)]
[im 1/24]
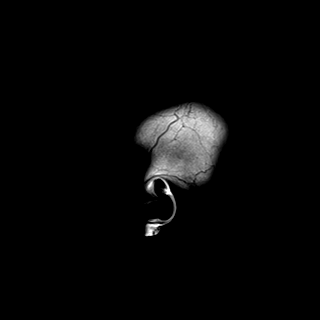

[Series 8: FLAIR · axial · 3.0mm · 0.75mm/px · z∈[-20,+119]mm · 3 of 48 slices shown]
[im 1/48]
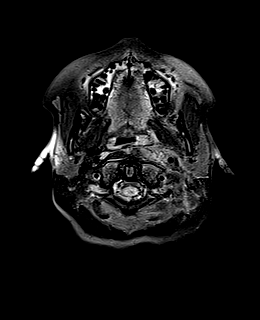
[im 24/48]
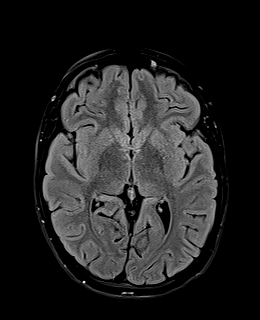
[im 48/48]
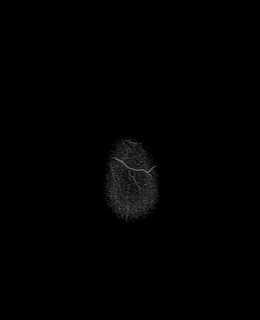

[Series 9: T2 · axial · 5.0mm · 0.62mm/px · 1 of 24 slices shown]
[im 1/24]
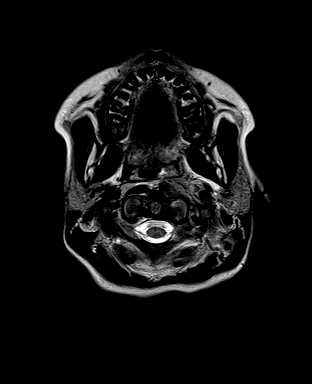

[Series 10: swi_images · axial · 3.0mm · 0.75mm/px · z∈[-27,+125]mm · 3 of 52 slices shown]
[im 1/52]
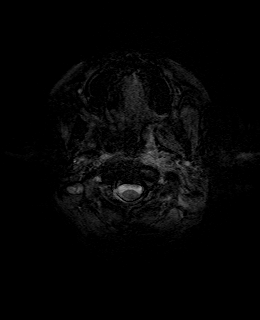
[im 26/52]
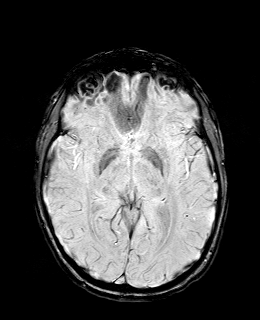
[im 52/52]
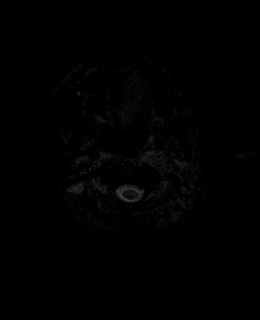

[Series 12: T1 · axial · 1.0mm · 0.94mm/px · z∈[-21,+120]mm · 9 of 144 slices shown (2 of 4)]
[im 1/144]
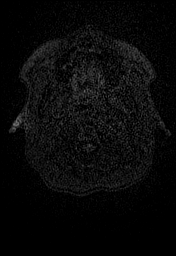
[im 18/144]
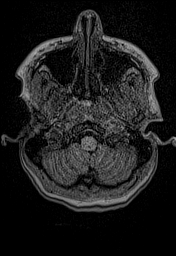
[im 36/144]
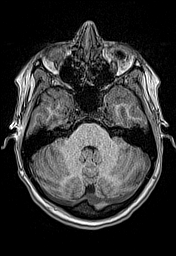
[im 54/144]
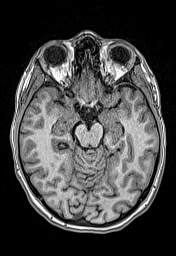
[im 72/144]
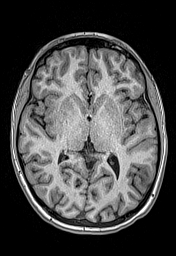
[im 90/144]
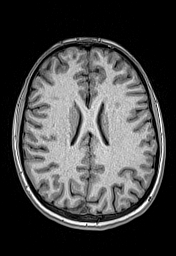
[im 108/144]
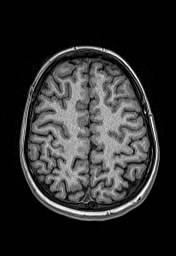
[im 126/144]
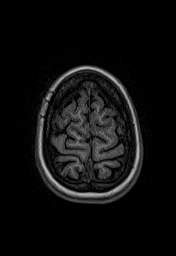
[im 144/144]
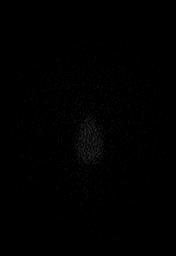

[Series 13: cor dwi_tracew · coronal · 5.0mm · 1.53mm/px · 3 of 48 slices shown]
[im 1/48]
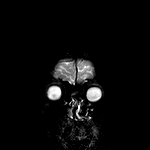
[im 24/48]
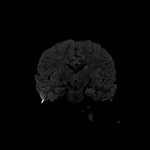
[im 48/48]
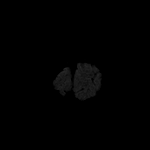

[Series 14: cor dwi_adc · coronal · 5.0mm · 1.53mm/px · 1 of 24 slices shown]
[im 1/24]
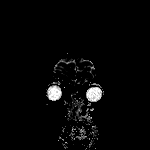

[Series 15: T2 post-contrast · coronal · 5.0mm · 0.57mm/px · 1 of 24 slices shown]
[im 1/24]
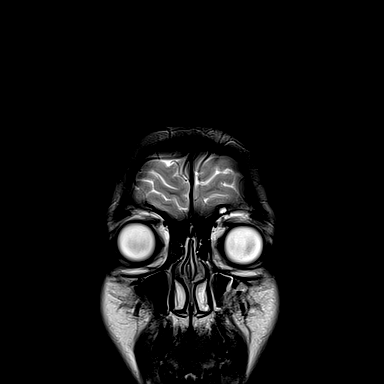

[Series 16: T1 post-contrast · axial · 1.0mm · 0.94mm/px · z∈[-21,+120]mm · 9 of 144 slices shown (1 of 3)]
[im 1/144]
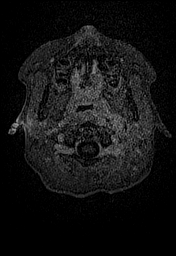
[im 18/144]
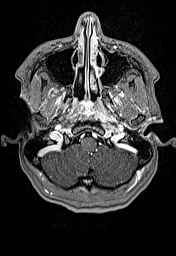
[im 36/144]
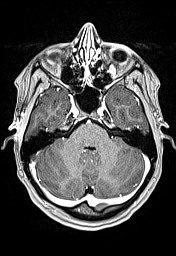
[im 54/144]
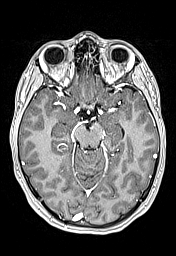
[im 72/144]
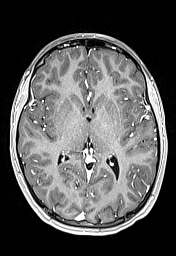
[im 90/144]
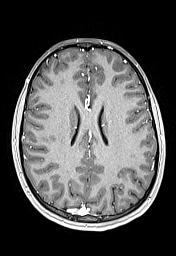
[im 108/144]
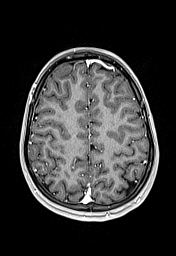
[im 126/144]
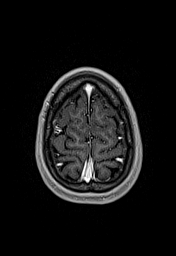
[im 144/144]
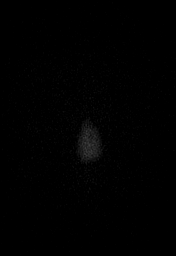

[Series 17: T1 · sagittal · 4.0mm · 0.94mm/px · 2 of 30 slices shown (3 of 4)]
[im 1/30]
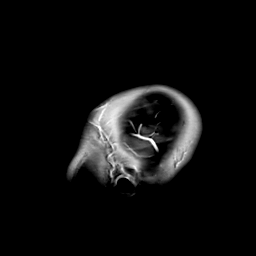
[im 30/30]
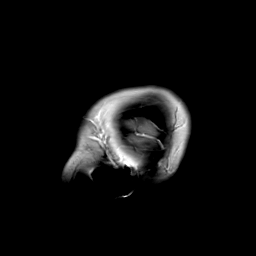

[Series 18: T1 · coronal · 4.0mm · 0.94mm/px · 2 of 36 slices shown (4 of 4)]
[im 1/36]
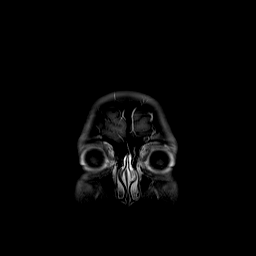
[im 36/36]
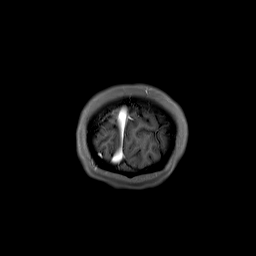

[Series 19: T1 post-contrast · coronal · 5.0mm · 0.43mm/px · 1 of 24 slices shown (2 of 3)]
[im 1/24]
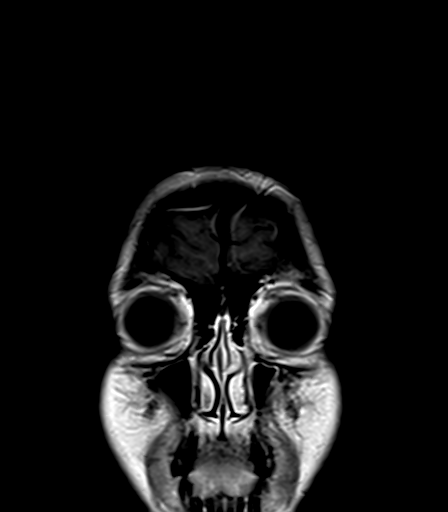

[Series 20: T1 post-contrast · sagittal · 5.0mm · 0.75mm/px · 1 of 24 slices shown (3 of 3)]
[im 1/24]
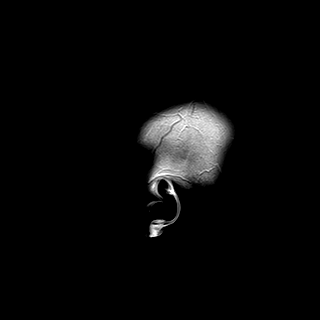

[48 of 48 positions shown; findings below may reference images not displayed]

FINDINGS: Brain: No acute infarction, hemorrhage, hydrocephalus, extra-axial
collection or mass lesion. No abnormal intracranial enhancement.

Vascular: Major arterial flow voids are maintained at the skull
base.

Skull and upper cervical spine: Normal marrow signal.

Sinuses/Orbits: Moderate left maxillary sinus mucosal thickening.
Otherwise, mild paranasal sinus mucosal thickening. Unremarkable
orbits.

Other: Abnormal edema, enhancement, and soft tissue thickening
involving the left skull base and visualized prevertebral and
paraspinal soft tissues. This involves the left prevertebral
musculature, left prevertebral parapharyngeal and carotid spaces.
The left tonsil and adenoid is displaced anteriorly with some
narrowing of the airway. On sagittal imaging there is evidence of
prevertebral soft tissue thickening extending inferiorly into the
upper neck, incompletely imaged. Discrete 1 x 0.7 x 1.2 Cm
peripherally enhancing fluid collection in the left prevertebral
prevertebral space, immediately adjacent to the left carotid space
(series 9, image 30 series 7, image 16). No sizable mastoid
effusions. The left internal jugular vein is completely effaced in
this region. The jugular bulb and intracranial dural venous sinuses
are patent.
IMPRESSION: 1. Abnormal edema, enhancement, and soft tissue thickening involving
the left skull base and upper neck, detailed above and concerning
for a severe deep neck infection. Discrete 1.2 cm peripherally
enhancing fluid collection in the left prevertebral space is
concerning for an abscess. Given findings are incompletely imaged,
recommend CT neck with contrast.
2. No evidence of acute intracranial abnormality.  No acute infarct.

## 2022-02-18 IMAGING — CT CT HEAD W/O CM
3 series · 16 of 45 positions shown, 19 images · non-contrast
Comparison: None.

CLINICAL DATA: Left side headache

EXAM:
CT HEAD WITHOUT CONTRAST
TECHNIQUE: Contiguous axial images were obtained from the base of the skull
through the vertex without intravenous contrast.

[Series 2: head wo · axial · 0.47mm/px · z∈[-142,-27]mm · 10 of 28 slices shown, 13 images]
[im 3/28  brain]
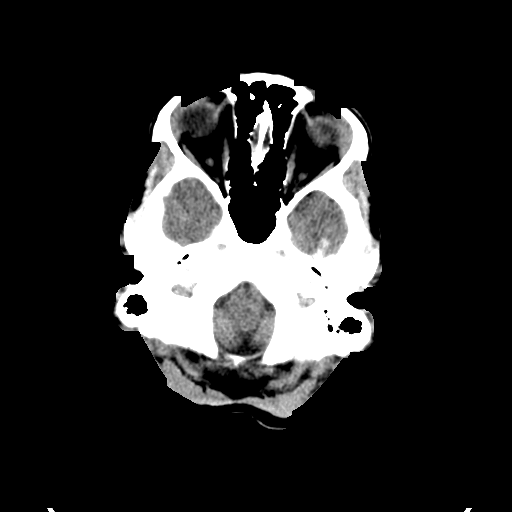
[im 3/28  bone]
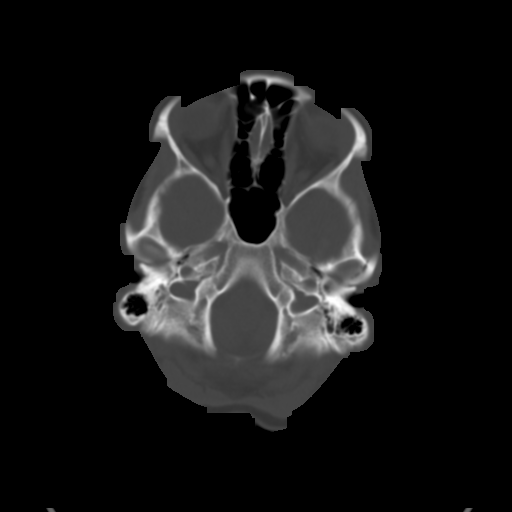
[im 5/28  brain]
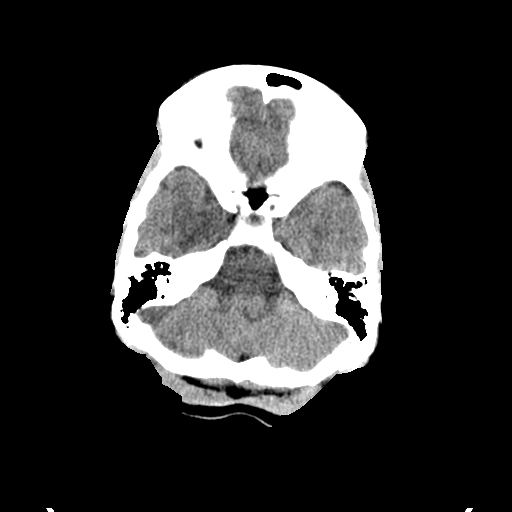
[im 8/28  brain]
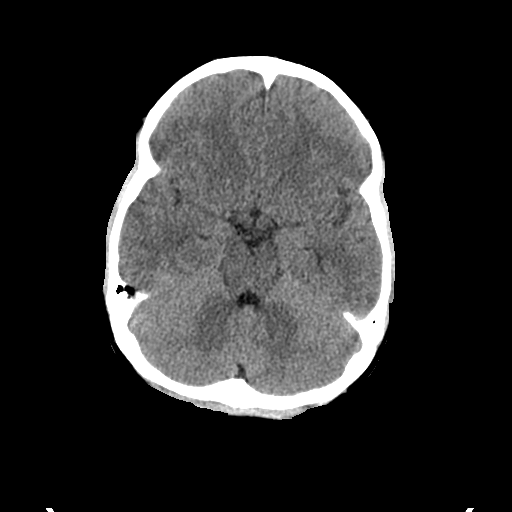
[im 11/28  brain]
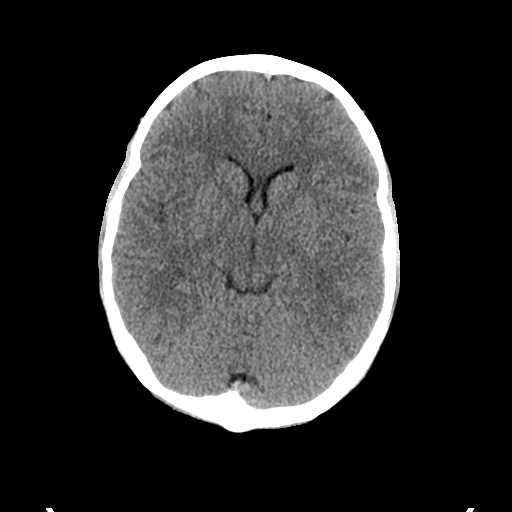
[im 13/28  brain]
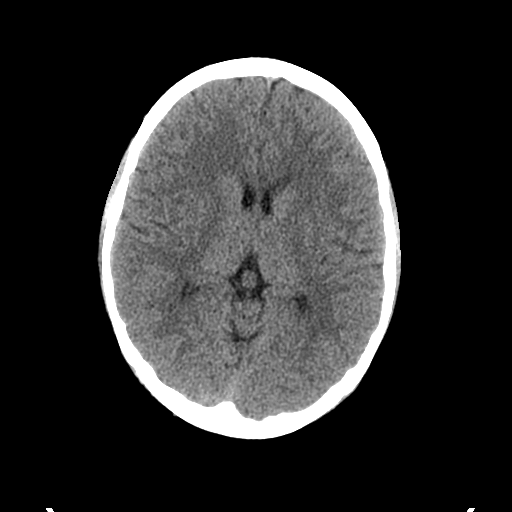
[im 13/28  bone]
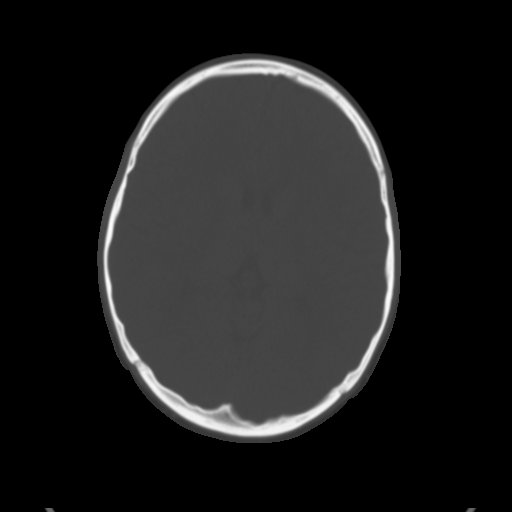
[im 16/28  brain]
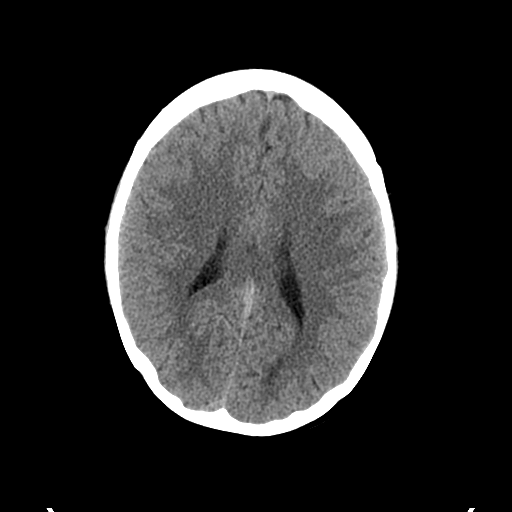
[im 18/28  brain]
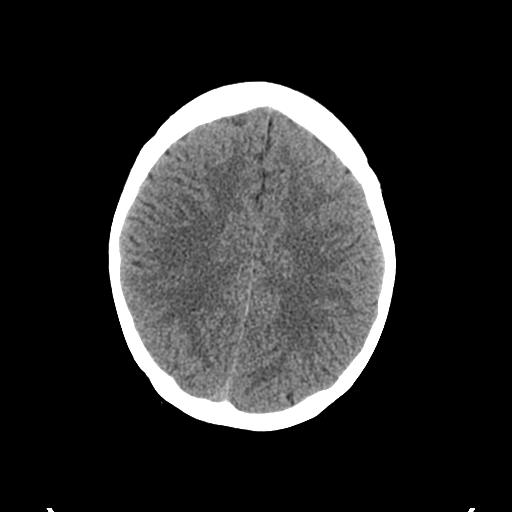
[im 21/28  brain]
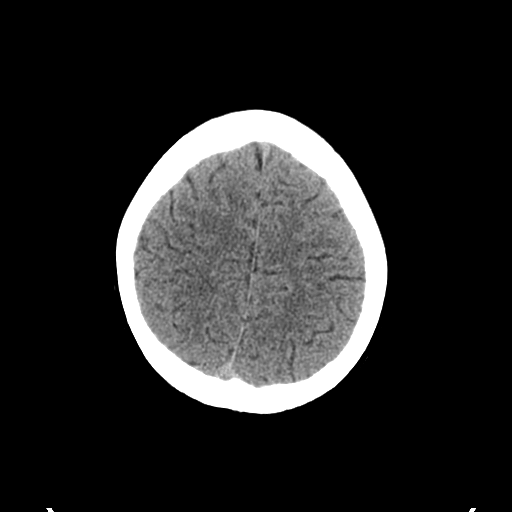
[im 24/28  brain]
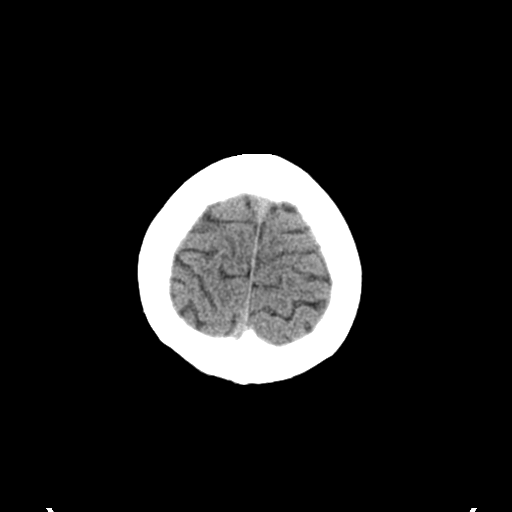
[im 24/28  bone]
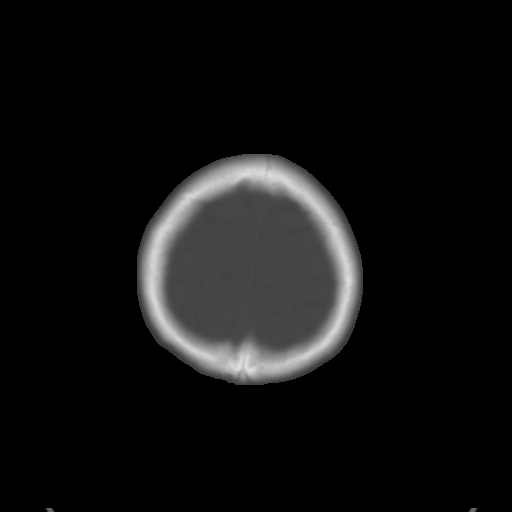
[im 26/28  brain]
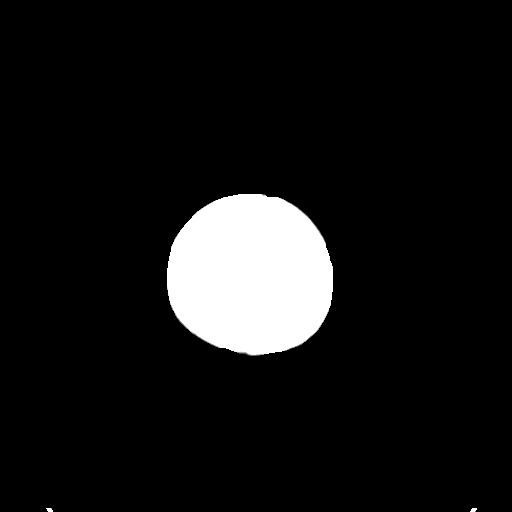

[Series 4: coronal soft tissue · coronal · 0.28mm/px · 3 of 62 slices shown]
[im 21/62  brain]
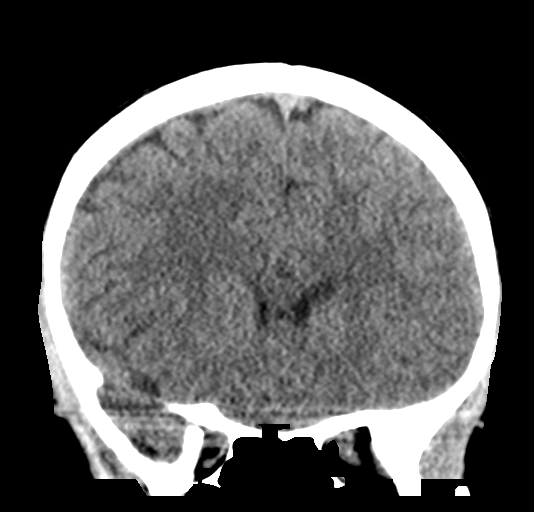
[im 28/62  brain]
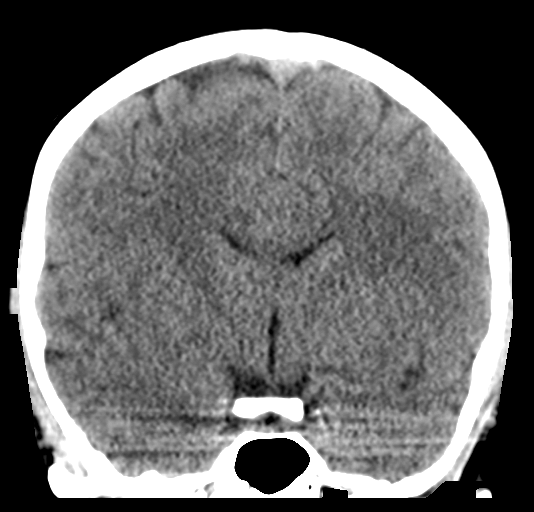
[im 34/62  brain]
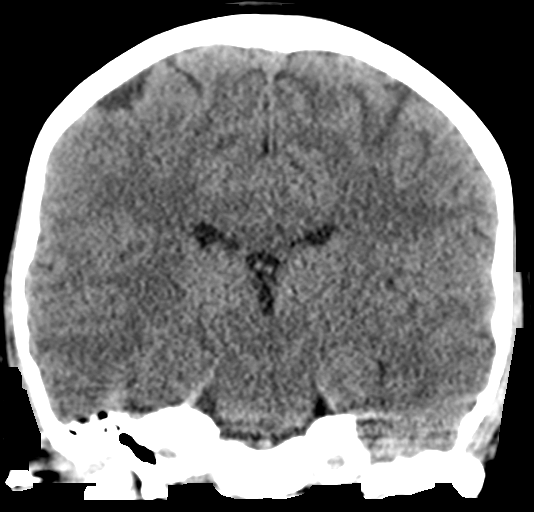

[Series 5: sagittal soft tissue · sagittal · 0.28mm/px · 3 of 48 slices shown]
[im 16/48  brain]
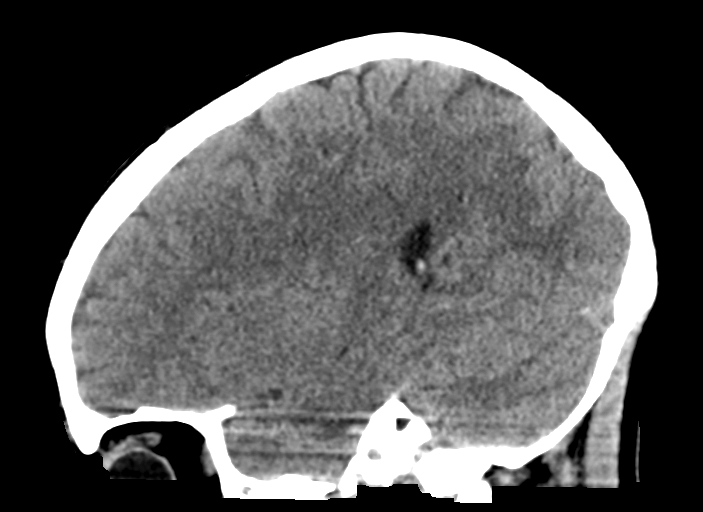
[im 24/48  brain]
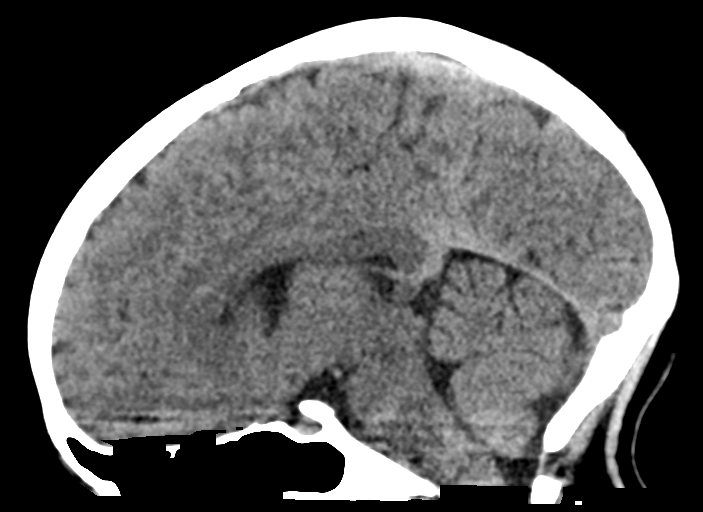
[im 32/48  brain]
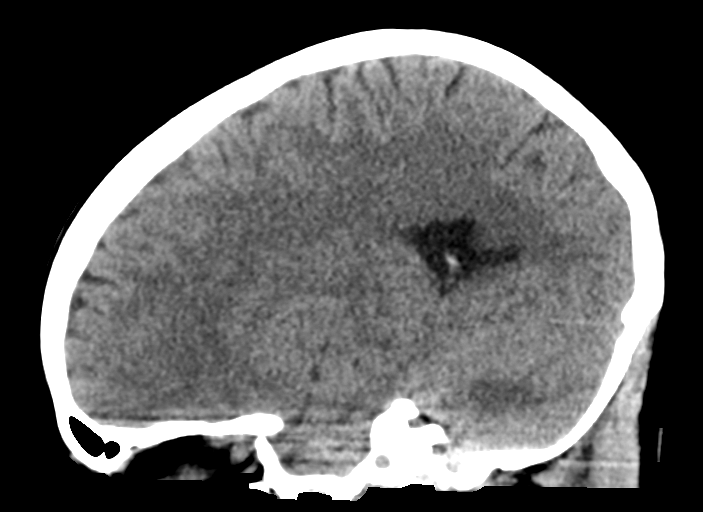

[16 of 45 positions shown; findings below may reference images not displayed]

FINDINGS: Brain: No acute intracranial abnormality. Specifically, no
hemorrhage, hydrocephalus, mass lesion, acute infarction, or
significant intracranial injury.

Vascular: No hyperdense vessel or unexpected calcification.

Skull: No acute calvarial abnormality.

Sinuses/Orbits: No acute findings

Other: None
IMPRESSION: Normal study.

## 2022-02-21 ENCOUNTER — Other Ambulatory Visit: Payer: Self-pay

## 2022-02-21 MED ORDER — AZSTARYS 26.1-5.2 MG PO CAPS: 26.1000 mg | ORAL_CAPSULE | Freq: Every day | ORAL | 0 refills | Status: DC

## 2022-02-21 NOTE — Telephone Encounter (Signed)
E-Prescribed Azstarys 26.1 directly to  [id number] [place] [place] [address] [place] [address] Phone: [phone] Fax: [phone]

## 2022-03-18 ENCOUNTER — Other Ambulatory Visit: Payer: Self-pay

## 2022-03-18 ENCOUNTER — Telehealth (INDEPENDENT_AMBULATORY_CARE_PROVIDER_SITE_OTHER): Payer: Medicaid Other | Admitting: Family

## 2022-03-18 ENCOUNTER — Encounter: Payer: Self-pay | Admitting: Family

## 2022-03-18 DIAGNOSIS — Z79899 Other long term (current) drug therapy: Secondary | ICD-10-CM | POA: Diagnosis not present

## 2022-03-18 DIAGNOSIS — Z719 Counseling, unspecified: Secondary | ICD-10-CM | POA: Diagnosis not present

## 2022-03-18 DIAGNOSIS — F411 Generalized anxiety disorder: Secondary | ICD-10-CM

## 2022-03-18 DIAGNOSIS — F900 Attention-deficit hyperactivity disorder, predominantly inattentive type: Secondary | ICD-10-CM | POA: Diagnosis not present

## 2022-03-18 DIAGNOSIS — Z7189 Other specified counseling: Secondary | ICD-10-CM

## 2022-03-18 NOTE — Progress Notes (Unsigned)
[facility] [address], [place]. [address] [place] [address] Dept: [phone] Dept Fax: [phone]  Medication Check visit via Virtual Video   Patient ID:  [name]  female DOB: 2001-04-22   21 y.o.   MRN: [id number]   DATE:03/18/22  PCP: Marcene Corning, MD  Virtual Visit via Video Note  I connected with  [name] on 03/18/22 at  9:30 AM EST by a video enabled telemedicine application and verified that I am speaking with the correct person using two identifiers. Patient/Parent Location: at school  I discussed the limitations, risks, security and privacy concerns of performing an evaluation and management service by telephone and the availability of in person appointments. I also discussed with the parents that there may be a patient responsible charge related to this service. The parents expressed understanding and agreed to proceed.  Provider: Carron Curie, NP  Location: private work location  HPI/CURRENT STATUS: [name] is here for medication management of the psychoactive medications for ADHD and review of educational and behavioral concerns.   [name] currently taking Azstarys 26.1- 5.2 mg daily, which is working well. Takes medication at breakfast time  Medication tends to wear off around evening time. Ritalin 5 mg PRN for homework time. [name] is able to focus through school & homework.   [name] is eating well (eating breakfast, lunch and dinner). [name] does not have appetite suppression and eating well.   Sleeping well (getting enough sleep each night), sleeping through the night. [name] does not have delayed sleep onset and getting plenty of sleep each night.   EDUCATION: School: [employer] Year/Grade:  college   Performance/ Grades: above average Services: Other: disability services  Activities/ Exercise: intermittently  MEDICAL HISTORY: Individual Medical History/ Review of Systems: Yes, consult  wisdom teeth and removal last Thursday. 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 Had CT scan with mass and removal needed in January. Has been healthy with no visits to the PCP. WCC due yearly.   Family Medical/ Social History: Parents recently moved to [place], [place] [employer] Lives with:  roommate  MENTAL HEALTH: Mental Health Issues:   Anxiety    Allergies: Allergies  Allergen Reactions   Norelgestromin-Eth Estradiol Dermatitis    Current Medications:  Current Outpatient Medications  Medication Instructions   adapalene (DIFFERIN) 0.1 % cream Topical, Daily   amoxicillin-clavulanate (AUGMENTIN) 875-125 MG tablet 1 tablet, Oral, 2 times daily   aspirin-acetaminophen-caffeine (EXCEDRIN MIGRAINE) 250-250-65 MG tablet 1 tablet, Every 6 hours PRN   AUROVELA 24 FE 1-20 MG-MCG(24) tablet 1 tablet, Oral, Daily   Azelastine HCl 137 MCG/SPRAY SOLN Each Nare   ibuprofen (ADVIL) 200 mg, Every 6 hours PRN   methylphenidate (RITALIN) 5 mg, Oral, Every evening   Multiple Vitamin (MULTIVITAMIN WITH MINERALS) TABS tablet 1 tablet, Oral, Daily   Serdexmethylphen-Dexmethylphen (AZSTARYS) 26.1-5.2 MG CAPS 26.1 mg, Oral, Daily   Medication Side Effects: None  DIAGNOSES:    ICD-10-CM   1. ADHD (attention deficit hyperactivity disorder), inattentive type  F90.0     2. Generalized anxiety disorder  F41.1     3. Medication management  Z79.899     4. Patient counseled  Z71.9     5. Goals of care, counseling/discussion  Z71.89      ASSESSMENT:      [name] is a 21 year old female with a history of ADHD and Anxiety. She has been maintained on Azstarys 26.1-5.2 mg daily with efficacy, but not cost effective each  month. Using Ritalin 5 mg in the pm PRN with coverage as needed for home work. 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 Has disability services in place at school for continued academic support.    PLAN/RECOMMENDATIONS:   School updates with progress this school year and applying to nursing school   Discussed current academics with continued support with disability services.   Encouraged continued eating habits with healthy variety of foods daily.  Supported exercise and activity on a regular basis with managing school work.  Recent issues with health concerns and medical follow ups discussed over the next few months.   Sleep habits discussed with no current issues reported. Getting plenty of sleep each night with sleep scheduled discussed.   Medication management of current symptoms with efficacy but cost for medication monthly has been costly.   Counseled medication pharmacokinetics, options, dosage, administration, desired effects, and possible side effects.   Astarys 26.1 mg daily, no Rx today Ritalin 5 mg pm, PRN, no Rx today  Encouraged patient to call pharmacy for Concerta stock, if wanting to return to that medication due to cost of current medication.    I discussed the assessment and treatment plan with [name]. [name] was provided an opportunity to ask questions and all were answered. [name] agreed with the plan and demonstrated an understanding of the instructions.  REVIEW OF CHART, FACE TO FACE CLINIC TIME AND DOCUMENTATION TIME DURING TODAY'S VISIT:  30 mins      NEXT APPOINTMENT:  05/27/2022-f/u visit scheduled Telehealth OK  The patient was advised to call back or seek an in-person evaluation if the symptoms worsen or if the condition fails to improve as anticipated.   Carron Curie, NP

## 2022-03-18 NOTE — Telephone Encounter (Signed)
error 

## 2022-05-13 ENCOUNTER — Telehealth (INDEPENDENT_AMBULATORY_CARE_PROVIDER_SITE_OTHER): Payer: Medicaid Other | Admitting: Family

## 2022-05-13 ENCOUNTER — Encounter: Payer: Self-pay | Admitting: Family

## 2022-05-13 DIAGNOSIS — F411 Generalized anxiety disorder: Secondary | ICD-10-CM

## 2022-05-13 DIAGNOSIS — Z79899 Other long term (current) drug therapy: Secondary | ICD-10-CM | POA: Diagnosis not present

## 2022-05-13 DIAGNOSIS — F900 Attention-deficit hyperactivity disorder, predominantly inattentive type: Secondary | ICD-10-CM

## 2022-05-13 DIAGNOSIS — Z719 Counseling, unspecified: Secondary | ICD-10-CM

## 2022-05-13 DIAGNOSIS — Z7189 Other specified counseling: Secondary | ICD-10-CM | POA: Diagnosis not present

## 2022-05-13 NOTE — Progress Notes (Signed)
[facility] [place]. [address] [address] Dept: [phone] Dept Fax: [phone]  Medication Check visit via Virtual Video   Patient ID:  [name]  female DOB: Sep 16, 2000   21 y.o.   MRN: [id number]   DATE:05/13/22  PCP: Lodema Pilot, MD  Virtual Visit via Video Note I connected with  [name] on 05/13/22 at  7:30 AM EST by a video enabled telemedicine application and verified that I am speaking with the correct person using two identifiers. Patient/Parent Location: at school  I discussed the limitations, risks, security and privacy concerns of performing an evaluation and management service by telephone and the availability of in person appointments. I also discussed with the parents that there may be a patient responsible charge related to this service. The parents expressed understanding and agreed to proceed.  Provider: Carolann Littler, NP  Location: private work location  HPI/CURRENT STATUS: [name] is here for medication management of the psychoactive medications for ADHD and review of educational and behavioral concerns.   [name] currently taking Azstarys 26.1-5.2 mg daily, which is working well. Takes medication at breakfast time. Medication tends to wear off around evening. [name] is able to focus through homework.   [name] is eating well (eating breakfast, lunch and dinner). [name] does not have appetite suppression and eating plenty.   Sleeping well (getting plenty of sleep each night), sleeping through the night. [name] does not have delayed sleep onset and getting plenty of activity.  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  Has been healthy with no visits to the PCP. Severance due yearly.   Family Medical/ Social History:  [name] Lives with: lives with roommate off campus  MENTAL HEALTH: Mental Health Issues:    No anxiety recently     Allergies: Allergies  Allergen Reactions   Norelgestromin-Eth Estradiol Dermatitis   Current Medications:  Current Outpatient Medications  Medication Instructions   adapalene (DIFFERIN) 0.1 % cream Topical, Daily   amoxicillin-clavulanate (AUGMENTIN) 875-125 MG tablet 1 tablet, Oral, 2 times daily   aspirin-acetaminophen-caffeine (EXCEDRIN MIGRAINE) 250-250-65 MG tablet 1 tablet, Every 6 hours PRN   AUROVELA 24 FE 1-20 MG-MCG(24) tablet 1 tablet, Oral, Daily   Azelastine HCl 137 MCG/SPRAY SOLN Each Nare   ibuprofen (ADVIL) 200 mg, Every 6 hours PRN   methylphenidate (RITALIN) 5 mg, Oral, Every evening   Multiple Vitamin (MULTIVITAMIN WITH MINERALS) TABS tablet 1 tablet, Oral, Daily   Serdexmethylphen-Dexmethylphen (AZSTARYS) 26.1-5.2 MG CAPS 26.1 mg, Oral, Daily   Medication Side Effects: None  DIAGNOSES:    ICD-10-CM   1. ADHD (attention deficit hyperactivity disorder), inattentive type  F90.0     2. Generalized anxiety disorder  F41.1     3. Medication management  Z79.899     4. Patient counseled  Z71.9     5. Goals of care, counseling/discussion  Z71.89     ASSESSMENT:      [name] is a 22 year old female with a history of ADHD and Anxiety. She has been maintained on Azstarys 26.1-5.2 mg daily dailyw ith efficacy and not filled the Ritalin 5 mg for evening dose. 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 Had sinus  surgery around the holidays with post op appt today. No other medical changes reported. Eating, sleeping and exercising with no current issues reported. Discussed filling the pm dose of medication and continuation of the Azstarys as previously. May consider changing to a provider  closer to her since parents moved to [place].   PLAN/RECOMMENDATIONS:  Updates with school and current academic progress for the semester.  Starting of nursing school in the summer with support given.  Updates with changes to medical and health in the past few months.  Supported exercising regularly and eating a good variety of foods.  Sleeping at least 8 hours with good sleep hygiene.   Medication management discussed with good efficacy.  May consider alternative provider closer to home in [place].   Counseled medication pharmacokinetics, options, dosage, administration, desired effects, and possible side effects.   Azstarys 26.1-52 mg daily, #30 with no RF's.RX for above e-scribed and sent to pharmacy on record  [id number] [place], [place] [address] [place] [address] Phone: [phone] Fax: [phone]  I discussed the assessment and treatment plan with [name]. [name] was provided an opportunity to ask questions and all were answered. [name] agreed with the plan and demonstrated an understanding of the instructions.  REVIEW OF CHART, FACE TO FACE CLINIC TIME AND DOCUMENTATION TIME DURING TODAY'S VISIT:  30 mins      NEXT APPOINTMENT:  09/28/2022    Telehealth OK  The patient was advised to call back or seek an in-person evaluation if the symptoms worsen or if the condition fails to improve as anticipated.   Carolann Littler, NP

## 2022-05-20 ENCOUNTER — Other Ambulatory Visit: Payer: Self-pay

## 2022-05-20 MED ORDER — METHYLPHENIDATE HCL 5 MG PO TABS: 5.0000 mg | ORAL_TABLET | Freq: Every evening | ORAL | 0 refills | Status: DC

## 2022-05-20 NOTE — Telephone Encounter (Signed)
Ritalin 5 mg daily in the afternoon, #30 with no RF's.RX for above e-scribed and sent to pharmacy on record  [id number] - [place], [place] [address] Phone: [phone] Fax: [phone]

## 2022-05-27 ENCOUNTER — Telehealth: Payer: Medicaid Other | Admitting: Family

## 2022-06-09 ENCOUNTER — Other Ambulatory Visit: Payer: Self-pay

## 2022-06-10 MED ORDER — AZSTARYS 26.1-5.2 MG PO CAPS: 1.0000 | ORAL_CAPSULE | Freq: Every day | ORAL | 0 refills | Status: AC

## 2022-06-10 MED ORDER — METHYLPHENIDATE HCL 5 MG PO TABS: 5.0000 mg | ORAL_TABLET | Freq: Every evening | ORAL | 0 refills | Status: AC

## 2022-09-28 ENCOUNTER — Institutional Professional Consult (permissible substitution): Payer: Medicaid Other | Admitting: Family
